# Patient Record
Sex: Male | Born: 1970 | Hispanic: Yes | Marital: Married | Smoking: Never smoker
Health system: Southern US, Community
[De-identification: ages and names within clinical notes are randomized; demographics above are authoritative.]

## PROBLEM LIST (undated history)

## (undated) DIAGNOSIS — K118 Other diseases of salivary glands: Secondary | ICD-10-CM

## (undated) HISTORY — DX: Other diseases of salivary glands: K11.8

---

## 2014-04-27 ENCOUNTER — Emergency Department: Payer: Self-pay | Admitting: Emergency Medicine

## 2018-10-06 DIAGNOSIS — Z8616 Personal history of COVID-19: Secondary | ICD-10-CM

## 2018-10-06 HISTORY — DX: Personal history of COVID-19: Z86.16

## 2021-10-03 ENCOUNTER — Emergency Department: Payer: Self-pay

## 2021-10-03 ENCOUNTER — Emergency Department
Admission: EM | Admit: 2021-10-03 | Discharge: 2021-10-03 | Disposition: A | Discharge status: Home / Self Care | Payer: Self-pay | Attending: Emergency Medicine | Admitting: Emergency Medicine

## 2021-10-03 ENCOUNTER — Other Ambulatory Visit: Payer: Self-pay

## 2021-10-03 DIAGNOSIS — R220 Localized swelling, mass and lump, head: Secondary | ICD-10-CM | POA: Insufficient documentation

## 2021-10-03 LAB — CBC WITH DIFFERENTIAL/PLATELET
Abs Immature Granulocytes: 0.01 10*3/uL (ref 0.00–0.07)
Basophils Absolute: 0 10*3/uL (ref 0.0–0.1)
Basophils Relative: 1 %
Eosinophils Absolute: 0.3 10*3/uL (ref 0.0–0.5)
Eosinophils Relative: 8 %
HCT: 37.5 % — ABNORMAL LOW (ref 39.0–52.0)
Hemoglobin: 13.1 g/dL (ref 13.0–17.0)
Immature Granulocytes: 0 %
Lymphocytes Relative: 26 %
Lymphs Abs: 1.1 10*3/uL (ref 0.7–4.0)
MCH: 32 pg (ref 26.0–34.0)
MCHC: 34.9 g/dL (ref 30.0–36.0)
MCV: 91.5 fL (ref 80.0–100.0)
Monocytes Absolute: 0.4 10*3/uL (ref 0.1–1.0)
Monocytes Relative: 9 %
Neutro Abs: 2.3 10*3/uL (ref 1.7–7.7)
Neutrophils Relative %: 56 %
Platelets: 271 10*3/uL (ref 150–400)
RBC: 4.1 MIL/uL — ABNORMAL LOW (ref 4.22–5.81)
RDW: 12 % (ref 11.5–15.5)
WBC: 4.1 10*3/uL (ref 4.0–10.5)
nRBC: 0 % (ref 0.0–0.2)

## 2021-10-03 LAB — COMPREHENSIVE METABOLIC PANEL
ALT: 29 U/L (ref 0–44)
AST: 21 U/L (ref 15–41)
Albumin: 3.8 g/dL (ref 3.5–5.0)
Alkaline Phosphatase: 73 U/L (ref 38–126)
Anion gap: 3 — ABNORMAL LOW (ref 5–15)
BUN: 14 mg/dL (ref 6–20)
CO2: 28 mmol/L (ref 22–32)
Calcium: 9 mg/dL (ref 8.9–10.3)
Chloride: 108 mmol/L (ref 98–111)
Creatinine, Ser: 0.92 mg/dL (ref 0.61–1.24)
GFR, Estimated: >60 mL/min (ref >60)
Glucose, Bld: 98 mg/dL (ref 70–99)
Potassium: 4.3 mmol/L (ref 3.5–5.1)
Sodium: 139 mmol/L (ref 135–145)
Total Bilirubin: 0.7 mg/dL (ref 0.3–1.2)
Total Protein: 8.4 g/dL — ABNORMAL HIGH (ref 6.5–8.1)

## 2021-10-03 MED ORDER — IOHEXOL 300 MG/ML  SOLN
75.0000 mL | Freq: Once | INTRAMUSCULAR | Status: AC | PRN
  Administered 2021-10-03: 16:00:00 75 mL via INTRAVENOUS
  Filled 2021-10-03: qty 75

## 2021-10-03 NOTE — ED Triage Notes (Signed)
Pt has an abscess on the R side of his face under his ear- pt states it started 4 weeks ago and has just gotten bigger- pt denies pain to the area

## 2021-10-03 NOTE — Discharge Instructions (Signed)
-  Please follow-up with the ENT provider listed above, as discussed. -Please return to the emergency department at any time if you begin to experience any new or worsening symptoms.

## 2021-10-03 NOTE — ED Provider Notes (Signed)
[facility] Emergency Department Provider Note    ____________________________________________   Event Date/Time   First MD Initiated Contact with Patient 10/03/21 1301     (approximate)  I have reviewed the triage vital signs and the nursing notes.   HISTORY  Chief Complaint Facial Swelling   HPI Charles Bowers is a 50 y.o. male, no known medical history, presents emergency department for evaluation of facial/neck swelling.  Patient states that he began to notice significant swelling under his right ear approximately 4 weeks ago.  States that it has progressively been getting worse.  It is not painful.  Denies fever/chills, difficulty swallowing, ear pain, sore throat, cough/congestion, chest pain, or shortness of breath.  Of note, patient states that he has not seen a doctor in 20 years.  He did however, have a dental procedure done approximately 1 year ago that required plates to be implanted where his molars were.  He states that they did this but he is molars were "falling apart".  Since then, he states that he has had significant weight loss.  He has lost 40 pounds over the course of 12 months without any change in diet or exercise.  History limited by: No limitations.  History reviewed. No pertinent past medical history.  There are no problems to display for this patient.   History reviewed. No pertinent surgical history.  Prior to Admission medications   Not on File    Allergies Patient has no known allergies.  No family history on file.  Social History Social History   Tobacco Use   Smoking status: Never   Smokeless tobacco: Never  Substance Use Topics   Alcohol use: Never    Review of Systems Review of Systems  Constitutional:  Negative for chills and fever.  HENT:  Negative for ear pain and sore throat.        Positive for facial/neck swelling.  Eyes:  Negative for blurred vision.  Respiratory:  Negative for sputum  production and shortness of breath.   Cardiovascular:  Negative for chest pain and leg swelling.  Gastrointestinal:  Negative for abdominal pain and vomiting.  Genitourinary:  Negative for dysuria, flank pain and hematuria.  Musculoskeletal:  Negative for myalgias.  Skin:  Negative for rash.  Neurological:  Negative for headaches.    10-point ROS otherwise negative. ____________________________________________   PHYSICAL EXAM:  VITAL SIGNS: ED Triage Vitals  Enc Vitals Group     BP 10/03/21 1229 111/66     Pulse Rate 10/03/21 1229 60     Resp 10/03/21 1229 18     Temp 10/03/21 1232 97.6 F (36.4 C)     Temp Source 10/03/21 1232 Oral     SpO2 10/03/21 1229 100 %     Weight 10/03/21 1231 140 lb (63.5 kg)     Height 10/03/21 1231 5\' 7"  (1.702 m)     Head Circumference --      Peak Flow --      Pain Score 10/03/21 1233 0     Pain Loc --      Pain Edu? --      Excl. in [place]? --     Physical Exam Constitutional:      General: He is not in acute distress.    Appearance: Normal appearance. He is not ill-appearing.  HENT:     Head: Normocephalic.     Comments: Large, non-tender, circumferential mass, approximately 5 cm in diameter, anterior and posterior to  the right ear.  No surrounding erythema.  No bleeding or discharge.    Nose: Nose normal.     Mouth/Throat:     Mouth: Mucous membranes are moist.     Pharynx: Oropharynx is clear.  Eyes:     Conjunctiva/sclera: Conjunctivae normal.     Pupils: Pupils are equal, round, and reactive to light.  Cardiovascular:     Rate and Rhythm: Normal rate and regular rhythm.  Pulmonary:     Effort: Pulmonary effort is normal. No respiratory distress.     Breath sounds: Normal breath sounds. No wheezing, rhonchi or rales.  Abdominal:     General: Abdomen is flat. There is no distension.     Palpations: Abdomen is soft.  Musculoskeletal:        General: Normal range of motion.     Cervical back: Normal range of motion and neck  supple.  Skin:    General: Skin is warm and dry.  Neurological:     General: No focal deficit present.     Mental Status: He is alert. Mental status is at baseline.  Psychiatric:        Mood and Affect: Mood normal.        Behavior: Behavior normal.        Thought Content: Thought content normal.        Judgment: Judgment normal.     ____________________________________________    LABS  (all labs ordered are listed, but only abnormal results are displayed)  Labs Reviewed  CBC WITH DIFFERENTIAL/PLATELET - Abnormal; Notable for the following components:      Result Value   RBC 4.10 (*)    HCT 37.5 (*)    All other components within normal limits  COMPREHENSIVE METABOLIC PANEL - Abnormal; Notable for the following components:   Total Protein 8.4 (*)    Anion gap 3 (*)    All other components within normal limits     ____________________________________________   EKG Not applicable.   ____________________________________________    RADIOLOGY I personally viewed and evaluated these images as part of my medical decision making, as well as reviewing the written report by the radiologist.  ED Provider Interpretation: I agree with the interpretation of the radiologist.  DG Chest 2 View  Result Date: 10/03/2021 CLINICAL DATA:  Unexplained weight loss. EXAM: CHEST - 2 VIEW COMPARISON:  Chest x-ray dated April 27, 2014. FINDINGS: The heart size and mediastinal contours are within normal limits. Both lungs are clear. The visualized skeletal structures are unremarkable. IMPRESSION: No active cardiopulmonary disease. Electronically Signed   By: Titus Dubin M.D.   On: 10/03/2021 14:02   CT Soft Tissue Neck W Contrast  Result Date: 10/03/2021 CLINICAL DATA:  Abscess on right-side of face under ear getting bigger, denies pain EXAM: CT NECK WITH CONTRAST TECHNIQUE: Multidetector CT imaging of the neck was performed using the standard protocol following the bolus  administration of intravenous contrast. CONTRAST:  73mL OMNIPAQUE IOHEXOL 300 MG/ML  SOLN COMPARISON:  None. FINDINGS: Pharynx and larynx: The nasal cavity and nasopharynx are unremarkable. There is a 1.4 cm cc by 1.8 cm TV by approximately 2.1 cm AP enhancing lesion in the left aspect of the soft palate (6-45, 2-26). There is no evidence of osseous erosion involving the adjacent hard palate. The oral cavity and oropharynx are otherwise unremarkable. The parapharyngeal spaces are clear. The hypopharynx and larynx are unremarkable. The vocal folds are normal. Salivary glands: There is a 3.6 cm AP by 2.9  cm TV by 3.8 cm cc solid lesion centered in the right parotid gland. The left parotid gland is abnormal in appearance with hyperdensity and interspersed fat. There is fatty atrophy of the submandibular glands which are essentially entirely replaced. Thyroid: Unremarkable. Lymph nodes: There are multiple enlarged right cervical chain lymph nodes: *1.4 cm x 1.6 cm by 1.8 cm right level IIa node with suspected internal necrosis but no definite extracapsular extension (2-36). *1.2 cm by 1.9 cm by 1.5 cm right level IIa lymph node just anteroinferior to the above-described node (2-34). *1.0 cm by 1.6 cm by 1.2 cm right level IIa node (2-31). *A few right level II B nodes measuring up to 6 mm in short axis. *1.2 cm x 1.5 cm by 1.6 cm right level IIa node (2-38). *Right level III nodes measuring 1.1 cm by 1.7 cm by 2.6 cm (2-55). And 1.2 cm by 1.4 cm by 1.8 cm (2-56). *Multiple prominent supraclavicular lymph nodes measuring up to 7 mm in short axis (2-68). On the left, there is a 0.9 cm x 1.1 cm by 1.5 cm level II a node (2-37). There are scattered additional subcentimeter cervical chain lymph nodes on the left. Vascular: There is mass effect on the right internal jugular vein by the above-described right level III nodes without evidence of occlusion. The vasculature is otherwise unremarkable. Limited intracranial: The  partially imaged intracranial compartment is unremarkable. Visualized orbits: The imaged globes and orbits are unremarkable. Mastoids and visualized paranasal sinuses: There is mild mucosal thickening in the imaged paranasal sinuses. The imaged mastoid air cells are clear. Skeleton: There is no aggressive osseous lesion to suggest osseous metastatic disease. Upper chest: The imaged lung apices are clear. Other: None. IMPRESSION: 1. Enhancing lesion in the soft palate described above suspicious for primary malignancy. There is bulky lymphadenopathy in the right neck with a large mass centered in the right parotid gland likely reflecting a metastatic lymph node, likely corresponding to the palpable abnormality. Some of the nodes on the right appear necrotic without evidence of extracapsular extension. Recommend ENT consultation. 2. Prominent left level II a lymph node measuring up to 0.9 cm in short axis is indeterminate but may also be metastatic. 3. Abnormal appearance of the bilateral parotid glands and fatty atrophy of the submandibular glands may reflect systemic process such as Sjogren's. Electronically Signed   By: Valetta Mole M.D.   On: 10/03/2021 16:57    ____________________________________________   PROCEDURES  Procedures   Medications  iohexol (OMNIPAQUE) 300 MG/ML solution 75 mL (75 mLs Intravenous Contrast Given 10/03/21 1614)    Critical Care performed: No  ____________________________________________   INITIAL IMPRESSION / ASSESSMENT AND PLAN / ED COURSE  Pertinent labs & imaging results that were available during my care of the patient were reviewed by me and considered in my medical decision making (see chart for details).      Charles Bowers is a 50 y.o. male, no known medical history, presents emergency department for evaluation of facial/neck swelling.  Patient states that he began to notice significant swelling under his right ear approximately 4 weeks ago.   States that it has progressively been getting worse.  It is not painful.  Denies fever/chills, difficulty swallowing, ear pain, sore throat, cough/congestion, chest pain, or shortness of breath.  Of note, patient states that he has not seen a doctor in 20 years.  He did however, have a dental procedure done approximately 1 year ago that required plates  to be implanted where his molars were.  He states that they did this but he is molars were "falling apart".  Since then, he states that he has had significant weight loss.  He has lost 40 pounds over the course of 12 months without any change in diet or exercise.  Upon entering the room, patient appears well.  NAD.  Physical exam notable for large, non-tender, circumferential mass, approximately 5 cm in diameter, anterior and posterior to the right ear.  No surrounding erythema.  No bleeding or discharge.  Otherwise unremarkable.  Vital signs are within normal limits.  CBC and CMP unremarkable.  CXR negative.  CT soft tissue neck shows enhancing lesion of the soft palate, suspicious for primary malignancy.  Bulky lymphadenopathy in the right neck with a large mass centered in the right parotid gland likely reflecting a metastatic lymph node.  I spoke with the on-call ENT specialist, Dr. Richardson Landry, who stated that he would follow up with this patient next week.   Patient is otherwise stable.  No further work-up or treatment indicated in the emergency department at this time.  We will plan to discharge this patient with instructions for follow-up.  Encouraged the patient to return to the emergency department at any time if he begins to experience any new or worsening symptoms.        ____________________________________________   FINAL CLINICAL IMPRESSION(S) / ED DIAGNOSES  Final diagnoses:  Right facial swelling     NEW MEDICATIONS STARTED DURING THIS VISIT:  ED Discharge Orders     None        Note:  This document was prepared  using Dragon voice recognition software and may include unintentional dictation errors.    Teodoro Spray, [place] 10/04/21 [id number]    Vladimir Crofts, MD 10/04/21 [phone]

## 2021-10-22 ENCOUNTER — Other Ambulatory Visit: Payer: Self-pay | Admitting: Otolaryngology

## 2021-10-22 DIAGNOSIS — K118 Other diseases of salivary glands: Secondary | ICD-10-CM

## 2021-10-30 ENCOUNTER — Ambulatory Visit
Admission: RE | Admit: 2021-10-30 | Discharge: 2021-10-30 | Disposition: A | Discharge status: Home / Self Care | Payer: Self-pay | Source: Ambulatory Visit | Attending: Otolaryngology | Admitting: Otolaryngology

## 2021-10-30 DIAGNOSIS — R221 Localized swelling, mass and lump, neck: Secondary | ICD-10-CM | POA: Insufficient documentation

## 2021-10-30 DIAGNOSIS — C8590 Non-Hodgkin lymphoma, unspecified, unspecified site: Secondary | ICD-10-CM

## 2021-10-30 DIAGNOSIS — K118 Other diseases of salivary glands: Secondary | ICD-10-CM | POA: Insufficient documentation

## 2021-10-30 HISTORY — DX: Non-Hodgkin lymphoma, unspecified, unspecified site: C85.90

## 2021-11-18 ENCOUNTER — Encounter: Payer: Self-pay | Admitting: Otolaryngology

## 2021-11-18 LAB — SURGICAL PATHOLOGY

## 2021-11-19 ENCOUNTER — Encounter: Payer: Self-pay | Admitting: *Deleted

## 2021-11-25 ENCOUNTER — Encounter: Payer: Self-pay | Admitting: Oncology

## 2021-11-25 ENCOUNTER — Inpatient Hospital Stay: Payer: Self-pay

## 2021-11-25 ENCOUNTER — Other Ambulatory Visit: Payer: Self-pay

## 2021-11-25 ENCOUNTER — Inpatient Hospital Stay: Payer: Self-pay | Attending: Oncology | Admitting: Oncology

## 2021-11-25 VITALS — BP 113/70 | HR 63 | Temp 98.0°F | Resp 16 | Ht 66.0 in | Wt 139.0 lb

## 2021-11-25 DIAGNOSIS — C884 Extranodal marginal zone B-cell lymphoma of mucosa-associated lymphoid tissue [MALT-lymphoma]: Secondary | ICD-10-CM

## 2021-11-25 DIAGNOSIS — C8510 Unspecified B-cell lymphoma, unspecified site: Secondary | ICD-10-CM | POA: Insufficient documentation

## 2021-11-25 LAB — CBC WITH DIFFERENTIAL/PLATELET
Abs Immature Granulocytes: 0 10*3/uL (ref 0.00–0.07)
Basophils Absolute: 0 10*3/uL (ref 0.0–0.1)
Basophils Relative: 1 %
Eosinophils Absolute: 0.4 10*3/uL (ref 0.0–0.5)
Eosinophils Relative: 10 %
HCT: 35.9 % — ABNORMAL LOW (ref 39.0–52.0)
Hemoglobin: 12.5 g/dL — ABNORMAL LOW (ref 13.0–17.0)
Immature Granulocytes: 0 %
Lymphocytes Relative: 22 %
Lymphs Abs: 0.9 10*3/uL (ref 0.7–4.0)
MCH: 31.6 pg (ref 26.0–34.0)
MCHC: 34.8 g/dL (ref 30.0–36.0)
MCV: 90.7 fL (ref 80.0–100.0)
Monocytes Absolute: 0.4 10*3/uL (ref 0.1–1.0)
Monocytes Relative: 9 %
Neutro Abs: 2.4 10*3/uL (ref 1.7–7.7)
Neutrophils Relative %: 58 %
Platelets: 249 10*3/uL (ref 150–400)
RBC: 3.96 MIL/uL — ABNORMAL LOW (ref 4.22–5.81)
RDW: 11.9 % (ref 11.5–15.5)
WBC: 4.1 10*3/uL (ref 4.0–10.5)
nRBC: 0 % (ref 0.0–0.2)

## 2021-11-25 LAB — COMPREHENSIVE METABOLIC PANEL
ALT: 28 U/L (ref 0–44)
AST: 21 U/L (ref 15–41)
Albumin: 4 g/dL (ref 3.5–5.0)
Alkaline Phosphatase: 68 U/L (ref 38–126)
Anion gap: 5 (ref 5–15)
BUN: 18 mg/dL (ref 6–20)
CO2: 23 mmol/L (ref 22–32)
Calcium: 8.8 mg/dL — ABNORMAL LOW (ref 8.9–10.3)
Chloride: 106 mmol/L (ref 98–111)
Creatinine, Ser: 0.82 mg/dL (ref 0.61–1.24)
GFR, Estimated: >60 mL/min (ref >60)
Glucose, Bld: 93 mg/dL (ref 70–99)
Potassium: 3.6 mmol/L (ref 3.5–5.1)
Sodium: 134 mmol/L — ABNORMAL LOW (ref 135–145)
Total Bilirubin: 0.2 mg/dL — ABNORMAL LOW (ref 0.3–1.2)
Total Protein: 8.4 g/dL — ABNORMAL HIGH (ref 6.5–8.1)

## 2021-11-25 LAB — HIV ANTIBODY (ROUTINE TESTING W REFLEX): HIV Screen 4th Generation wRfx: NONREACTIVE

## 2021-11-25 LAB — LACTATE DEHYDROGENASE: LDH: 131 U/L (ref 98–192)

## 2021-11-25 LAB — HEPATITIS B CORE ANTIBODY, TOTAL: Hep B Core Total Ab: NONREACTIVE

## 2021-11-25 NOTE — Progress Notes (Signed)
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 Richardson Landry  Date of visit: 11/25/21   History of presenting illness-[name] and history obtained with the help ofSpanish interpreter.  He does not have any known medical problems. He was noted to have swelling involving his right cheek that has been ongoing for the last 2 to 3 months.  Patient is here with his son today who reports that he feels that patient has lost a lot of weight in the last 3 months.  Patient endorses that his appetite is poor.  Denies any significant pain.  Patient was seen by ENT and underwent CT soft tissue neck which showed an enhancing lesion in the soft palate concerning for primary malignancy.  Bulky lymphadenopathy in the right neck with large mass centered in the right parotid gland multiple enlarged right cervical chain lymph nodes measuring up to 2 cm in size.  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  Ki-67 10 to 20%.  Overall immunoreactivity supports a differential diagnosis of extranodal marginal zone lymphoma versus lymphoplasmacytic lymphoma.  NYD 88 testing on the biopsy specimen was negative.  Patient referred for further management.  ECOG PS- 0  Pain scale- 0   Review of systems- Review of Systems  Constitutional:  Positive for malaise/fatigue and weight loss. Negative for chills and fever.  HENT:  Negative for  congestion, ear discharge and nosebleeds.   Eyes:  Negative for blurred vision.  Respiratory:  Negative for cough, hemoptysis, sputum production, shortness of breath and wheezing.   Cardiovascular:  Negative for chest pain, palpitations, orthopnea and claudication.  Gastrointestinal:  Negative for abdominal pain, blood in stool, constipation, diarrhea, heartburn, melena, nausea and vomiting.  Genitourinary:  Negative for dysuria, flank pain, frequency, hematuria and urgency.  Musculoskeletal:  Negative for back pain, joint pain and myalgias.  Skin:  Negative for rash.  Neurological:  Negative for dizziness, tingling, focal weakness, seizures, weakness and headaches.  Endo/Heme/Allergies:  Does not bruise/bleed easily.  Psychiatric/Behavioral:  Negative for depression and suicidal ideas. The patient does not have insomnia.    No Known Allergies  There are no problems to display for this patient.    Past Medical History:  Diagnosis Date   History of COVID-19 2020   Lymphoma ([place]) 10/30/2021   low grade b cell lymphoma   Mass of right parotid gland      History reviewed. No pertinent surgical history.  Social History   Socioeconomic History   Marital status: Single    Spouse name: Not on file   Number of children: Not on file   Years of education: Not on file   Highest education level: Not on file  Occupational History   Not on file  Tobacco Use   Smoking status: Never    Passive exposure: Never   Smokeless tobacco: Never  Vaping Use   Vaping Use: Never used  Substance and Sexual Activity   Alcohol use: Never   Drug use: Never  Sexual activity: Yes  Other Topics Concern   Not on file  Social History Narrative   Not on file   Social Determinants of Health   Financial Resource Strain: Not on file  Food Insecurity: Not on file  Transportation Needs: Not on file  Physical Activity: Not on file  Stress: Not on file  Social Connections: Not on file  Intimate  Partner Violence: Not on file     History reviewed. No pertinent family history.   Current Outpatient Medications:    acetaminophen (TYLENOL) 500 MG tablet, Take 500 mg by mouth daily., Disp: , Rfl:    Physical exam:  Vitals:   11/25/21 1637  BP: 113/70  Pulse: 63  Resp: 16  Temp: 98 F (36.7 C)  TempSrc: Oral  Weight: 139 lb (63 kg)  Height: 5' 6"  (1.676 m)   Physical Exam Constitutional:      General: He is not in acute distress. Cardiovascular:     Rate and Rhythm: Normal rate and regular rhythm.     Heart sounds: Normal heart sounds.  Pulmonary:     Effort: Pulmonary effort is normal.     Breath sounds: Normal breath sounds.  Abdominal:     General: Bowel sounds are normal.     Palpations: Abdomen is soft.  Lymphadenopathy:     Comments: Palpable right-sided cervical adenopathy noted at the base of the liver and extending downwards towards the neck.  No palpable left cervical adenopathy.  No palpable bilateral axillary or inguinal adenopathy.  Skin:    General: Skin is warm and dry.  Neurological:     Mental Status: He is alert and oriented to person, place, and time.       CMP Latest Ref Rng & Units 11/25/2021  Glucose 70 - 99 mg/dL 93  BUN 6 - 20 mg/dL 18  Creatinine 0.61 - 1.24 mg/dL 0.82  Sodium 135 - 145 mmol/L 134(L)  Potassium 3.5 - 5.1 mmol/L 3.6  Chloride 98 - 111 mmol/L 106  CO2 22 - 32 mmol/L 23  Calcium 8.9 - 10.3 mg/dL 8.8(L)  Total Protein 6.5 - 8.1 g/dL 8.4(H)  Total Bilirubin 0.3 - 1.2 mg/dL 0.2(L)  Alkaline Phos 38 - 126 U/L 68  AST 15 - 41 U/L 21  ALT 0 - 44 U/L 28   CBC Latest Ref Rng & Units 11/25/2021  WBC 4.0 - 10.5 K/uL 4.1  Hemoglobin 13.0 - 17.0 g/dL 12.5(L)  Hematocrit 39.0 - 52.0 % 35.9(L)  Platelets 150 - 400 K/uL 249    No images are attached to the encounter.  [place] CORE BIOPSY (LYMPH NODES)  Result Date: 10/30/2021 INDICATION: Right-sided parotid mass versus pathologic lymph node EXAM: Ultrasound-guided core needle  biopsy of right-sided submandibular mass MEDICATIONS: None. ANESTHESIA/SEDATION: Local analgesia FLUOROSCOPY TIME:  N/a COMPLICATIONS: None immediate. PROCEDURE: Informed written consent was obtained from the patient after a thorough discussion of the procedural risks, benefits and alternatives. All questions were addressed. Maximal Sterile Barrier Technique was utilized including caps, mask, sterile gowns, sterile gloves, sterile drape, hand hygiene and skin antiseptic. A timeout was performed prior to the initiation of the procedure. The patient was placed supine on the exam table. Limited ultrasound of the right submandibular region was performed. This again demonstrated a lobulated and heterogeneous mass in the area of the right parotid gland, compatible with right parotid mass versus pathologic lymphadenopathy/metastatic involvement. Skin entry site was marked, and the overlying skin was prepped and draped in the standard sterile fashion.  Local analgesia was obtained with 1% lidocaine. Under ultrasound guidance, core needle biopsy was performed of the identified lesion using an 18 gauge core biopsy device. A total of 6 passes were made, and specimens were submitted in saline to pathology for further handling. Limited postprocedure imaging demonstrated expected post biopsy changes without hematoma or complicating feature. After hemostasis manual pressure, clean dressing was placed. The patient tolerated the procedure well without immediate complication. IMPRESSION: Successful ultrasound-guided core needle biopsy of right submandibular mass. Electronically Signed   By: Albin Felling M.D.   On: 10/30/2021 14:50    Assessment and plan- Patient is a 51 y.o. male with newly diagnosed extranodal marginal B-cell lymphoma  Discussed the results of the CT soft tissue neck and pathology with the patient and his son in detail.  Parotid mass biopsy was consistent with low-grade B-cell lymphoma either lymphoplasmacytic  lymphoma or extranodal marginal zone lymphoma.  NYD 88 was negative and thereby making extranodal marginal zone lymphoma more likely.  However I would like to get a PET scan to see if there are any other hypermetabolic areas which would affect staging.  Also typically low-grade lymphoma should have a low SUV uptake.  If there are any high SUV uptake areas noted on PET scan I would be inclined to repeat biopsy of those lymph nodes.  I will also obtain bone marrow biopsy to complete his staging work-up.  If all this ultimately turns out to be low-grade extranodal marginal B-cell lymphoma I am inclined to treat this with Rituxan based combination chemotherapy given that patient is symptomatic from the bulk of the tumor.  It is unlikely that radiation alone would suffice to treat this tumor.  I will see the patient after PET scan and bone marrow biopsy results are back to discuss further management   Thank you for this kind referral and the opportunity to participate in the care of this patient   Visit Diagnosis 1. Extranodal marginal zone B-cell lymphoma (HCC)     Dr. Randa Evens, MD, MPH [facility] [phone] 11/25/2021  4:32 PM

## 2021-11-26 ENCOUNTER — Telehealth: Payer: Self-pay

## 2021-11-26 LAB — KAPPA/LAMBDA LIGHT CHAINS
Kappa free light chain: 53 mg/L — ABNORMAL HIGH (ref 3.3–19.4)
Kappa, lambda light chain ratio: 2.3 — ABNORMAL HIGH (ref 0.26–1.65)
Lambda free light chains: 23 mg/L (ref 5.7–26.3)

## 2021-11-26 NOTE — Telephone Encounter (Signed)
Attempted to reach patient x2. No answer, but left detailed VM with appt info for BM bx, PET scan and MD follow up. Left office phone number to call back with any questions. Also mailed pt appt reminders with spanish instructions.

## 2021-11-26 NOTE — Telephone Encounter (Signed)
Attempted to reach pt's son, Aaron Edelman, and no answer. Detailed VM left as well.

## 2021-11-27 ENCOUNTER — Encounter: Payer: Self-pay | Admitting: Licensed Clinical Social Worker

## 2021-11-27 NOTE — Telephone Encounter (Signed)
Attempted to contact pt again x2 and no answer. I was able to talk to pt's son Aaron Edelman and reviewed appts and instructions with him. He verbalized understanding.

## 2021-11-27 NOTE — Progress Notes (Signed)
[place] Work  Clinical Social Work was referred by Engineer, site for assessment of psychosocial needs.  Clinical Social Worker contacted patient by phone  to offer support and assess for needs.  CSW left voicemail in Spanish with contact information and request for return call.   Adelene Amas, [place]       First Attempt

## 2021-12-02 ENCOUNTER — Other Ambulatory Visit: Payer: Self-pay | Admitting: Radiology

## 2021-12-03 ENCOUNTER — Ambulatory Visit
Admission: RE | Admit: 2021-12-03 | Discharge: 2021-12-03 | Disposition: A | Discharge status: Home / Self Care | Payer: Self-pay | Source: Ambulatory Visit | Attending: Oncology | Admitting: Oncology

## 2021-12-03 ENCOUNTER — Other Ambulatory Visit: Payer: Self-pay

## 2021-12-03 DIAGNOSIS — D649 Anemia, unspecified: Secondary | ICD-10-CM | POA: Insufficient documentation

## 2021-12-03 DIAGNOSIS — C884 Extranodal marginal zone B-cell lymphoma of mucosa-associated lymphoid tissue [MALT-lymphoma]: Secondary | ICD-10-CM | POA: Insufficient documentation

## 2021-12-03 DIAGNOSIS — D72822 Plasmacytosis: Secondary | ICD-10-CM | POA: Insufficient documentation

## 2021-12-03 LAB — CBC WITH DIFFERENTIAL/PLATELET
Abs Immature Granulocytes: 0.01 10*3/uL (ref 0.00–0.07)
Basophils Absolute: 0 10*3/uL (ref 0.0–0.1)
Basophils Relative: 1 %
Eosinophils Absolute: 0.5 10*3/uL (ref 0.0–0.5)
Eosinophils Relative: 10 %
HCT: 33.6 % — ABNORMAL LOW (ref 39.0–52.0)
Hemoglobin: 11.6 g/dL — ABNORMAL LOW (ref 13.0–17.0)
Immature Granulocytes: 0 %
Lymphocytes Relative: 29 %
Lymphs Abs: 1.5 10*3/uL (ref 0.7–4.0)
MCH: 30.8 pg (ref 26.0–34.0)
MCHC: 34.5 g/dL (ref 30.0–36.0)
MCV: 89.1 fL (ref 80.0–100.0)
Monocytes Absolute: 0.4 10*3/uL (ref 0.1–1.0)
Monocytes Relative: 8 %
Neutro Abs: 2.8 10*3/uL (ref 1.7–7.7)
Neutrophils Relative %: 52 %
Platelets: 264 10*3/uL (ref 150–400)
RBC: 3.77 MIL/uL — ABNORMAL LOW (ref 4.22–5.81)
RDW: 11.9 % (ref 11.5–15.5)
WBC: 5.3 10*3/uL (ref 4.0–10.5)
nRBC: 0 % (ref 0.0–0.2)

## 2021-12-03 MED ORDER — FENTANYL CITRATE (PF) 100 MCG/2ML IJ SOLN
INTRAMUSCULAR | Status: AC | PRN
Start: 2021-12-03 — End: 2021-12-03
  Administered 2021-12-03: 50 ug via INTRAVENOUS

## 2021-12-03 MED ORDER — MIDAZOLAM HCL 5 MG/5ML IJ SOLN
INTRAMUSCULAR | Status: AC | PRN
  Administered 2021-12-03: 1 mg via INTRAVENOUS

## 2021-12-03 MED ORDER — HEPARIN SOD (PORK) LOCK FLUSH 100 UNIT/ML IV SOLN
INTRAVENOUS | Status: AC
  Filled 2021-12-03: qty 5

## 2021-12-03 MED ORDER — MIDAZOLAM HCL 2 MG/2ML IJ SOLN
INTRAMUSCULAR | Status: AC
  Filled 2021-12-03: qty 2

## 2021-12-03 MED ORDER — FENTANYL CITRATE (PF) 100 MCG/2ML IJ SOLN
INTRAMUSCULAR | Status: AC
  Filled 2021-12-03: qty 2

## 2021-12-03 MED ORDER — SODIUM CHLORIDE 0.9 % IV SOLN: INTRAVENOUS | Status: DC

## 2021-12-03 NOTE — Progress Notes (Signed)
Patient clinically stable post BMB per Dr Denna Haggard, tolerated well. Vitals stable pre and post procedure. Received Versed 1 mg along with Fentanyl 50 mcg IV for procedure. Son at bedside post procedure with update given. Report given to [facility] RN post procedure/specials.

## 2021-12-03 NOTE — Procedures (Signed)
Interventional Radiology Procedure Note  Date of Procedure: 12/03/2021  Procedure: BMBx  Findings:  1. CT BMBx right posterior ilium    Complications: No immediate complications noted.   Estimated Blood Loss: minimal  Follow-up and Recommendations: 1. Bedrest 1 hour    Albin Felling, MD  Vascular & Interventional Radiology  12/03/2021 9:46 AM

## 2021-12-03 NOTE — H&P (Signed)
Chief Complaint: Patient was seen in consultation today for lymphoma at the request of [place] [facility]  Referring Physician(s): Rao,Archana C  Supervising Physician: Juliet Rude  Patient Status: [facility] - Out-pt  History of Present Illness: Charles Bowers is a 51 y.o. male with PMHx significant for newly diagnosed extranodal marginal B-cell lymphoma s/p right sided submandibular biopsy with IR 10/30/21. The patient follows with Oncology, Dr. Janese Banks and request received for image guided bone marrow biopsy with moderate sedation.   The patient has had a H&P performed within the last 30 days, all history, medications, and exam have been reviewed. The patient denies any interval changes since the H&P.  The patient denies any current chest pain or shortness of breath. He has no known complications to sedation.   Past Medical History:  Diagnosis Date   History of COVID-19 2020   Lymphoma ([place]) 10/30/2021   low grade b cell lymphoma   Mass of right parotid gland     History reviewed. No pertinent surgical history.  Allergies: Patient has no known allergies.  Medications: Prior to Admission medications   Medication Sig Start Date End Date Taking? Authorizing Provider  acetaminophen (TYLENOL) 500 MG tablet Take 500 mg by mouth daily.    [provider]     History reviewed. No pertinent family history.  Social History   Socioeconomic History   Marital status: Married    Spouse name: Not on file   Number of children: Not on file   Years of education: Not on file   Highest education level: Not on file  Occupational History   Not on file  Tobacco Use   Smoking status: Never    Passive exposure: Never   Smokeless tobacco: Never  Vaping Use   Vaping Use: Never used  Substance and Sexual Activity   Alcohol use: Never   Drug use: Never   Sexual activity: Yes  Other Topics Concern   Not on file  Social History Narrative   Not on file   Social  Determinants of Health   Financial Resource Strain: Not on file  Food Insecurity: Not on file  Transportation Needs: Not on file  Physical Activity: Not on file  Stress: Not on file  Social Connections: Not on file   Review of Systems: A 12 point ROS discussed and pertinent positives are indicated in the HPI above.  All other systems are negative.  Review of Systems  Vital Signs: BP 110/75    Pulse 64    Temp 97.7 F (36.5 C) (Oral)    Resp 17    SpO2 97%   Physical Exam Constitutional:      Appearance: Normal appearance.  HENT:     Head: Normocephalic and atraumatic.  Cardiovascular:     Rate and Rhythm: Normal rate and regular rhythm.  Pulmonary:     Effort: Pulmonary effort is normal.     Breath sounds: Normal breath sounds.  Skin:    General: Skin is warm and dry.  Neurological:     Mental Status: He is alert and oriented to person, place, and time.    Imaging: No results found.  Labs:  CBC: Recent Labs    10/03/21 1333 11/25/21 1223  WBC 4.1 4.1  HGB 13.1 12.5*  HCT 37.5* 35.9*  PLT 271 249    COAGS: No results for input(s): INR, APTT in the last 8760 hours.  BMP: Recent Labs    10/03/21 1333 11/25/21 1223  NA 139 134*  K 4.3 3.6  CL 108 106  CO2 28 23  GLUCOSE 98 93  BUN 14 18  CALCIUM 9.0 8.8*  CREATININE 0.92 0.82  GFRNONAA >60 >60    LIVER FUNCTION TESTS: Recent Labs    10/03/21 1333 11/25/21 1223  BILITOT 0.7 0.2*  AST 21 21  ALT 29 28  ALKPHOS 73 68  PROT 8.4* 8.4*  ALBUMIN 3.8 4.0    Assessment and Plan: This is a 51 year old male with PMHx significant for newly diagnosed extranodal marginal B-cell lymphoma s/p submandibular biopsy with IR 10/30/21. The patient follows with Oncology, Dr. Janese Banks and request received for image guided bone marrow biopsy with moderate sedation.   The patient has been NPO, no medical changes since recent office visit, labs and vitals have been reviewed.  Risks and benefits of image guided bone  marrow biopsy with moderate sedation was discussed with the patient and/or patient's family and medical spanish interpreter today including, but not limited to bleeding, infection, damage to adjacent structures or low yield requiring additional tests.  All of the questions were answered and there is agreement to proceed.  Consent signed and in chart.   Thank you for this interesting consult.  I greatly enjoyed meeting [name] and look forward to participating in their care.  A copy of this report was sent to the requesting provider on this date.  Electronically Signed: Hedy Jacob, PA-C 12/03/2021, 8:00 AM   I spent a total of 15 Minutes in face to face in clinical consultation, greater than 50% of which was counseling/coordinating care for extranodal marginal B cell lymphoma.

## 2021-12-05 ENCOUNTER — Encounter: Payer: Self-pay | Admitting: Licensed Clinical Social Worker

## 2021-12-05 NOTE — Progress Notes (Addendum)
[place] ?Clinical Social Work ? ?Clinical Social Work was referred by nurse navigator for assessment of psychosocial needs.  Clinical Social Worker contacted patient by phone  to offer support and assess for needs.  CSW left contact information and request for return call. ? ? ? ?Benney Sommerville, LCSW  ?Clinical Social Worker ?[place] ?      ?Second Attempt ?

## 2021-12-06 ENCOUNTER — Ambulatory Visit (HOSPITAL_COMMUNITY)
Admission: RE | Admit: 2021-12-06 | Discharge: 2021-12-06 | Disposition: A | Discharge status: Home / Self Care | Payer: Self-pay | Source: Ambulatory Visit | Attending: Oncology | Admitting: Oncology

## 2021-12-06 ENCOUNTER — Other Ambulatory Visit: Payer: Self-pay

## 2021-12-06 DIAGNOSIS — C884 Extranodal marginal zone B-cell lymphoma of mucosa-associated lymphoid tissue [MALT-lymphoma]: Secondary | ICD-10-CM | POA: Insufficient documentation

## 2021-12-06 LAB — GLUCOSE, CAPILLARY: Glucose-Capillary: 100 mg/dL — ABNORMAL HIGH (ref 70–99)

## 2021-12-06 MED ORDER — FLUDEOXYGLUCOSE F - 18 (FDG) INJECTION
7.0000 | Freq: Once | INTRAVENOUS | Status: AC
  Administered 2021-12-06: 7.2 via INTRAVENOUS

## 2021-12-11 ENCOUNTER — Other Ambulatory Visit: Payer: Self-pay

## 2021-12-11 ENCOUNTER — Encounter (HOSPITAL_COMMUNITY): Payer: Self-pay | Admitting: Oncology

## 2021-12-11 ENCOUNTER — Encounter: Payer: Self-pay | Admitting: Oncology

## 2021-12-11 ENCOUNTER — Inpatient Hospital Stay: Payer: Self-pay | Attending: Oncology | Admitting: Oncology

## 2021-12-11 VITALS — BP 121/77 | HR 68 | Temp 97.3°F | Resp 16 | Wt 139.6 lb

## 2021-12-11 DIAGNOSIS — Z8616 Personal history of COVID-19: Secondary | ICD-10-CM | POA: Insufficient documentation

## 2021-12-11 DIAGNOSIS — Z79899 Other long term (current) drug therapy: Secondary | ICD-10-CM | POA: Insufficient documentation

## 2021-12-11 DIAGNOSIS — Z5111 Encounter for antineoplastic chemotherapy: Secondary | ICD-10-CM | POA: Insufficient documentation

## 2021-12-11 DIAGNOSIS — C884 Extranodal marginal zone B-cell lymphoma of mucosa-associated lymphoid tissue [MALT-lymphoma]: Secondary | ICD-10-CM | POA: Insufficient documentation

## 2021-12-11 DIAGNOSIS — Z7189 Other specified counseling: Secondary | ICD-10-CM

## 2021-12-11 NOTE — Progress Notes (Signed)
The patient is curious and will like to talk about sexual relations with his wife, his wife believes cancer is contagious. 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  ?

## 2021-12-12 LAB — SURGICAL PATHOLOGY

## 2021-12-13 ENCOUNTER — Encounter: Payer: Self-pay | Admitting: Oncology

## 2021-12-13 DIAGNOSIS — C884 Extranodal marginal zone b-cell lymphoma of mucosa-associated lymphoid tissue (malt-lymphoma) not having achieved remission: Secondary | ICD-10-CM | POA: Insufficient documentation

## 2021-12-13 NOTE — Progress Notes (Unsigned)
Hematology/Oncology Consult note [facility]  Telephone:([phone] Fax:([phone]) [phone]  Patient Care Team: Pcp, No as PCP - General Sindy Guadeloupe, MD as Consulting Physician (Oncology) Clyde Canterbury, MD as Referring Physician (Otolaryngology)   Name of the patient: Charles Bowers  [id number]  Sep 29, 1971   Date of visit: 12/13/21  Diagnosis- ***  Chief complaint/ Reason for visit- ***  Heme/Onc history: ***  Interval history- ***  ECOG PS- *** Pain scale- *** Opioid associated constipation- ***  Review of systems- ROS   Current treatment- ***  No Known Allergies   Past Medical History:  Diagnosis Date   History of COVID-19 2020   Lymphoma ([place]) 10/30/2021   low grade b cell lymphoma   Mass of right parotid gland      History reviewed. No pertinent surgical history.  Social History   Socioeconomic History   Marital status: Married    Spouse name: Not on file   Number of children: Not on file   Years of education: Not on file   Highest education level: Not on file  Occupational History   Not on file  Tobacco Use   Smoking status: Never    Passive exposure: Never   Smokeless tobacco: Never  Vaping Use   Vaping Use: Never used  Substance and Sexual Activity   Alcohol use: Never   Drug use: Never   Sexual activity: Yes  Other Topics Concern   Not on file  Social History Narrative   Not on file   Social Determinants of Health   Financial Resource Strain: Not on file  Food Insecurity: Not on file  Transportation Needs: Not on file  Physical Activity: Not on file  Stress: Not on file  Social Connections: Not on file  Intimate Partner Violence: Not on file    History reviewed. No pertinent family history.   Current Outpatient Medications:    acetaminophen (TYLENOL) 500 MG tablet, Take 500 mg by mouth daily., Disp: , Rfl:   Physical exam:  Vitals:   12/11/21 1309  BP: 121/77  Pulse: 68  Resp: 16   Temp: (!) 97.3 F (36.3 C)  SpO2: 99%  Weight: 139 lb 9.6 oz (63.3 kg)   Physical Exam   CMP Latest Ref Rng & Units 11/25/2021  Glucose 70 - 99 mg/dL 93  BUN 6 - 20 mg/dL 18  Creatinine 0.61 - 1.24 mg/dL 0.82  Sodium 135 - 145 mmol/L 134(L)  Potassium 3.5 - 5.1 mmol/L 3.6  Chloride 98 - 111 mmol/L 106  CO2 22 - 32 mmol/L 23  Calcium 8.9 - 10.3 mg/dL 8.8(L)  Total Protein 6.5 - 8.1 g/dL 8.4(H)  Total Bilirubin 0.3 - 1.2 mg/dL 0.2(L)  Alkaline Phos 38 - 126 U/L 68  AST 15 - 41 U/L 21  ALT 0 - 44 U/L 28   CBC Latest Ref Rng & Units 12/03/2021  WBC 4.0 - 10.5 K/uL 5.3  Hemoglobin 13.0 - 17.0 g/dL 11.6(L)  Hematocrit 39.0 - 52.0 % 33.6(L)  Platelets 150 - 400 K/uL 264    No images are attached to the encounter.  NM PET Image Initial (PI) Skull Base To Thigh  Result Date: 12/08/2021 CLINICAL DATA:  Initial treatment strategy for extranodal marginal zone B-cell lymphoma. EXAM: NUCLEAR MEDICINE PET SKULL BASE TO THIGH TECHNIQUE: 7.2 mCi F-18 FDG was injected intravenously. Full-ring PET imaging was performed from the skull base to thigh after the radiotracer. CT data was obtained and  used for attenuation correction and anatomic localization. Fasting blood glucose: 100 mg/dl COMPARISON:  10/03/2021 neck CT. FINDINGS: Mediastinal blood pool activity: SUV max 1.8 Liver activity: SUV max 2.9 NECK: Hypermetabolic solid 2.9 x 2.1 cm right parotid mass with max SUV 4.1 (series 4/image 28), mildly decreased from 3.7 x 2.7 cm on 10/03/2021 CT. A few mildly enlarged mildly hypermetabolic high right level 2 neck lymph nodes, largest 1.2 cm short axis diameter with max SUV 4.3 (series 4/image 35), mildly decreased from 1.4 cm. Mildly hypermetabolic 1.7 x 0.9 cm soft tissue nodule anterior to the right parotid gland with max SUV 3.9 (series 4/image 28), minimally decreased from 1.8 x 0.9 cm. Mildly hypermetabolic 0.9 cm left level 2 neck lymph node with max SUV 2.7 (series 4/image 35), stable in size.  Incidental CT findings: none CHEST: No enlarged or hypermetabolic axillary, mediastinal or hilar lymph nodes. No hypermetabolic pulmonary findings. Incidental CT findings: No significant pulmonary nodules. ABDOMEN/PELVIS: No abnormal hypermetabolic activity within the liver, pancreas, adrenal glands, or spleen. No hypermetabolic lymph nodes in the abdomen or pelvis. Incidental CT findings: Suggestion of diffuse bladder wall thickening, potentially due to under distension. SKELETON: No focal hypermetabolic activity to suggest skeletal metastasis. Incidental CT findings: none IMPRESSION: 1. Hypermetabolic right parotid mass, soft tissue nodule anterior to the right parotid gland and bilateral level 2 neck lymph nodes, compatible with lymphoma, all stable to mildly decreased in size since 09/13/2021 neck CT. Deauville category 4. 2. No hypermetabolic lymphoma in the chest, abdomen, pelvis or skeleton. Normal size spleen. 3. Suggestion of diffuse bladder wall thickening, potentially due to underdistension. Consider correlation with urinalysis. Electronically Signed   By: Ilona Sorrel M.D.   On: 12/08/2021 15:03   CT BONE MARROW BIOPSY & ASPIRATION  Result Date: 12/03/2021 INDICATION: extranodal marginal b cell  zone  lymphoma EXAM: CT BONE MARROW BIOPSY AND ASPIRATION MEDICATIONS: None. ANESTHESIA/SEDATION: Moderate (conscious) sedation was employed during this procedure. A total of Versed 1 mg and Fentanyl 50 mcg was administered intravenously. Moderate Sedation Time: 10 minutes. The patient's level of consciousness and vital signs were monitored continuously by radiology nursing throughout the procedure under my direct supervision. FLUOROSCOPY TIME:  N/a COMPLICATIONS: None immediate. PROCEDURE: Informed written consent was obtained from the patient after a thorough discussion of the procedural risks, benefits and alternatives. All questions were addressed. Maximal Sterile Barrier Technique was utilized including  caps, mask, sterile gowns, sterile gloves, sterile drape, hand hygiene and skin antiseptic. A timeout was performed prior to the initiation of the procedure. The patient was placed prone on the CT exam table. Limited CT of the pelvis was performed for planning purposes. Skin entry site was marked, and the overlying skin was prepped and draped in the standard sterile fashion. Local analgesia was obtained with 1% lidocaine. Using CT guidance, an 11 gauge needle was advanced just deep to the cortex of the right posterior ilium. Subsequently, bone marrow aspiration and core biopsy were performed. Specimens were submitted to lab/pathology for handling. Hemostasis was achieved with manual pressure, and a clean dressing was placed. The patient tolerated the procedure well without immediate complication. IMPRESSION: Successful CT-guided bone marrow aspiration and core biopsy of the right posterior ilium. Electronically Signed   By: Albin Felling M.D.   On: 12/03/2021 11:46     Assessment and plan- Patient is a 51 y.o. male ***   Visit Diagnosis 1. Extranodal marginal zone B-cell lymphoma (HCC)      Dr. Randa Evens, MD, MPH  [facility] at [facility] [phone] 12/13/2021 5:23 PM

## 2021-12-13 NOTE — Progress Notes (Signed)
START OFF PATHWAY REGIMEN - Lymphoma and CLL ? ? ?[id number]:Rituximab (Weekly): ?  Administer weekly: ?    Rituximab-xxxx  ? ?**Always confirm dose/schedule in your pharmacy ordering system** ? ?Patient Characteristics: ?Marginal Zone Lymphoma, Localized, Other ?Disease Type: Marginal Zone Lymphoma ?Disease Type: Not Applicable ?Disease Type: Not Applicable ?Localized or Systemic Disease<= Localized ?Localized Disease Type: Other ?Intent of Therapy: ?Non-Curative / Palliative Intent, Discussed with Patient ?

## 2021-12-18 ENCOUNTER — Inpatient Hospital Stay: Payer: Self-pay

## 2021-12-20 ENCOUNTER — Inpatient Hospital Stay: Payer: Self-pay

## 2021-12-20 ENCOUNTER — Other Ambulatory Visit: Payer: Self-pay

## 2021-12-20 VITALS — BP 105/58 | HR 73 | Temp 98.4°F | Resp 20 | Wt 140.4 lb

## 2021-12-20 DIAGNOSIS — C884 Extranodal marginal zone B-cell lymphoma of mucosa-associated lymphoid tissue [MALT-lymphoma]: Secondary | ICD-10-CM

## 2021-12-20 LAB — COMPREHENSIVE METABOLIC PANEL
ALT: 25 U/L (ref 0–44)
AST: 24 U/L (ref 15–41)
Albumin: 3.9 g/dL (ref 3.5–5.0)
Alkaline Phosphatase: 68 U/L (ref 38–126)
Anion gap: 7 (ref 5–15)
BUN: 16 mg/dL (ref 6–20)
CO2: 24 mmol/L (ref 22–32)
Calcium: 8.8 mg/dL — ABNORMAL LOW (ref 8.9–10.3)
Chloride: 104 mmol/L (ref 98–111)
Creatinine, Ser: 1.05 mg/dL (ref 0.61–1.24)
GFR, Estimated: >60 mL/min (ref >60)
Glucose, Bld: 128 mg/dL — ABNORMAL HIGH (ref 70–99)
Potassium: 3.5 mmol/L (ref 3.5–5.1)
Sodium: 135 mmol/L (ref 135–145)
Total Bilirubin: 0.5 mg/dL (ref 0.3–1.2)
Total Protein: 8.1 g/dL (ref 6.5–8.1)

## 2021-12-20 LAB — CBC WITH DIFFERENTIAL/PLATELET
Abs Immature Granulocytes: 0.01 10*3/uL (ref 0.00–0.07)
Basophils Absolute: 0 10*3/uL (ref 0.0–0.1)
Basophils Relative: 1 %
Eosinophils Absolute: 0.4 10*3/uL (ref 0.0–0.5)
Eosinophils Relative: 9 %
HCT: 37.1 % — ABNORMAL LOW (ref 39.0–52.0)
Hemoglobin: 13 g/dL (ref 13.0–17.0)
Immature Granulocytes: 0 %
Lymphocytes Relative: 31 %
Lymphs Abs: 1.5 10*3/uL (ref 0.7–4.0)
MCH: 31.5 pg (ref 26.0–34.0)
MCHC: 35 g/dL (ref 30.0–36.0)
MCV: 89.8 fL (ref 80.0–100.0)
Monocytes Absolute: 0.4 10*3/uL (ref 0.1–1.0)
Monocytes Relative: 8 %
Neutro Abs: 2.4 10*3/uL (ref 1.7–7.7)
Neutrophils Relative %: 51 %
Platelets: 238 10*3/uL (ref 150–400)
RBC: 4.13 MIL/uL — ABNORMAL LOW (ref 4.22–5.81)
RDW: 11.8 % (ref 11.5–15.5)
WBC: 4.7 10*3/uL (ref 4.0–10.5)
nRBC: 0 % (ref 0.0–0.2)

## 2021-12-20 LAB — HEPATITIS B SURFACE ANTIGEN: Hepatitis B Surface Ag: NONREACTIVE

## 2021-12-20 LAB — HEPATITIS B CORE ANTIBODY, TOTAL: Hep B Core Total Ab: NONREACTIVE

## 2021-12-20 MED ORDER — DIPHENHYDRAMINE HCL 25 MG PO CAPS
50.0000 mg | ORAL_CAPSULE | Freq: Once | ORAL | Status: AC
  Administered 2021-12-20: 50 mg via ORAL
  Filled 2021-12-20: qty 2

## 2021-12-20 MED ORDER — ACETAMINOPHEN 325 MG PO TABS
650.0000 mg | ORAL_TABLET | Freq: Once | ORAL | Status: AC
  Administered 2021-12-20: 650 mg via ORAL
  Filled 2021-12-20: qty 2

## 2021-12-20 MED ORDER — SODIUM CHLORIDE 0.9 % IV SOLN
Freq: Once | INTRAVENOUS | Status: AC
  Filled 2021-12-20: qty 250

## 2021-12-20 MED ORDER — SODIUM CHLORIDE 0.9 % IV SOLN
375.0000 mg/m2 | Freq: Once | INTRAVENOUS | Status: AC
  Administered 2021-12-20: 600 mg via INTRAVENOUS
  Filled 2021-12-20: qty 50

## 2021-12-20 NOTE — Patient Instructions (Addendum)
Rituximab Injection ??Qu? es [employer]? ?El RITUXIMAB es un anticuerpo monoclonal. Se utiliza para tratar ciertos tipos de c?ncer, tales como el linfoma no Hodgkin y la leucemia linfoc?tica cr?nica. Tambi?n se [place] para tratar la artritis reumatoide, la poliangitis granulomatosa, la poliangitis microsc?pica y el p?nfigo vulgar. ?Este medicamento puede ser utilizado para otros usos; si tiene alguna pregunta consulte con su proveedor de atenci?n m?dica o con su farmac?utico. ?MARCAS COMUNES: RIABNI, Rituxan, RUXIENCE ??Qu? le debo informar a mi profesional de la salud antes de tomar este medicamento? ?Necesitan saber si usted presenta alguno de los siguientes problemas o situaciones: ?dolor en el pecho ?enfermedad cardiaca ?infecci?n, especialmente una infecci?n viral, tales como varicela, fuegos labiales, hepatitis B o herpes ?problemas del sistema inmunol?gico ?ritmo o frecuencia cardiaca irregular ?enfermedad renal ?recuentos sangu?neos bajos (gl?bulos blancos, plaquetas o gl?bulos rojos) ?enfermedad pulmonar ?vacuna reciente o programada ?una reacci?n al?rgica o inusual al rituximab, a otros medicamentos, alimentos, colorantes o conservantes ?si est? embarazada o buscando quedar embarazada ?si est? amamantando a un beb? ??C?mo debo utilizar este medicamento? ?Este medicamento se inyecta en una vena. Lo administra un proveedor de atenci?n m?dica en un hospital o en un entorno cl?nico. ?Se le entregar? una Gu?a del medicamento (MedGuide, su nombre en ingl?s) especial antes de cada tratamiento. Aseg?rese de leer esta informaci?n cada vez cuidadosamente. ?Hable con su proveedor de atenci?n m?dica sobre el uso de este medicamento en ni?os. Aunque este medicamento se puede recetar a ni?os tan peque?os como de 6 meses de edad con ciertas afecciones, existen precauciones que deben tomarse. ?Sobredosis: P?ngase en contacto inmediatamente con un centro toxicol?gico o una sala de urgencia si usted cree que haya tomado  demasiado medicamento. ?ATENCI?N: [employer] es solo para usted. No comparta este medicamento con nadie. ??Qu? sucede si me olvido de una dosis? ?Cumpla con las citas para dosis de seguimiento. Es importante no olvidar ninguna dosis. Llame a su proveedor de atenci?n m?dica si no puede asistir a una cita. ??Qu? puede interactuar con este medicamento? ?No use esta medicina con ninguno de los siguientes medicamentos: ?vacunas vivas ?Esta medicina tambi?n puede interactuar con los siguientes medicamentos: ?cisplatino ?Puede ser que esta lista no menciona todas las posibles interacciones. Informe a su profesional de la salud de [employer] productos a base de hierbas, medicamentos de venta libre o suplementos nutritivos que est? tomando. Si usted fuma, consume bebidas alcoh?licas o si utiliza drogas ilegales, ind?queselo tambi?n a su profesional de [employer]	[place]. Algunas sustancias pueden interactuar con su medicamento. ??A qu? debo estar atento al [employer]? ?Se supervisar? su estado de salud atentamente mientras reciba este medicamento. Usted podr?a necesitar realizarse an?lisis de Environmental health practitioner est? [employer]. ?Este medicamento puede causar reacciones graves a la infusi?n. Para reducir Catering manager, su proveedor de atenci?n m?dica podr?a darle otros medicamentos que deber? usar antes de Health and safety inspector. Aseg?rese de seguir las instrucciones de su proveedor de atenci?n m?dica. ?Este medicamento puede aumentar su riesgo de Museum/gallery curator una infecci?n. Consulte con su proveedor de atenci?n m?dica si tiene fiebre, escalofr?os, dolor de garganta, o cualquier otro s?ntoma de resfr?o o gripe. No se trate usted mismo. Trate de no acercarse a personas que est?n enfermas. ?Llame a su proveedor de atenci?n m?dica si est? expuesto a cualquier persona con sarampi?n o varicela, o si desarrolla llagas o ampollas que no sanan correctamente. ?Evite usar medicamentos que contienen aspirina, acetaminofeno, ibuprofeno,  naproxeno o ketoprofeno, a menos que as? lo indique su proveedor de atenci?n m?dica. Estos productos  pueden ocultar la fiebre. ?Este medicamento puede causar reacciones graves en la piel. Pueden suceder semanas a meses despu?s de comenzar a Environmental consultant. Contacte a su proveedor de atenci?n m?dica de inmediato si nota que tiene fiebre o s?ntomas gripales con una erupci?n. La erupci?n puede ser roja o Phill Myron, y luego puede convertirse en ampollas o descamaci?n de la piel. O bien, es posible que observe una erupci?n roja con hinchaz?n en la cara, los labios o los ganglios linf?ticos en el cuello o debajo de los brazos. ?En algunos pacientes, este medicamento puede causar una infecci?n grave en el cerebro que puede llevar a la muerte. Si tiene cualquier problema para ver, pensar, hablar, caminar o pararse, inf?rmeselo a su profesional de [employer]	[place] de inmediato. Si no puede ponerse en contacto con su profesional de la salud, busque otra fuente de atenci?n m?dica de [place] urgente. ?No debe quedar embarazada mientras est? [employer], o al menos por 12 meses despu?s de dejar de usarlo. Las mujeres deben informar a su proveedor de atenci?n m?dica si est?n buscando quedar embarazadas o si creen que podr?an estar embarazadas. Existe la posibilidad de da?os graves en un beb? sin nacer. Para obtener m?s informaci?n, hable con su proveedor de atenci?n m?dica. Las mujeres deben usar un m?todo anticonceptivo confiable mientras est?n recibiendo [employer] y durante 12 meses despu?s de dejar de usarlo. No debe amamantar a un beb? mientras est? tomando [employer], o al menos por 6 meses despu?s de dejar de usarlo. ??Qu? efectos secundarios puedo tener al [employer] medicamento? ?Efectos secundarios que debe informar a su proveedor de atenci?n m?dica tan pronto como sea posible: ?reacciones al?rgicas (erupci?n cut?nea, comez?n/picaz?n o urticaria; hinchaz?n de la cara, los labios o la  [place]) ?diarrea ?edema (aumento repentino de peso; hinchaz?n de los tobillos, los pies, las manos u otra hinchaz?n inusual; dificultad para Ambulance person) ?frecuencia cardiaca r?pida e irregular ?ataque cardiaco (problemas para respirar; dolor u opresi?n en el pecho, cuello, espalda o brazos; debilidad o cansancio inusuales) ?infecci?n (fiebre, escalofr?os, tos, dolor de garganta, dolor o dificultad para orinar) ?lesi?n renal (dificultad para orinar o cambios en la cantidad de [place]) ?lesi?n en el h?gado (orina amarilla oscura o marr?n; sensaci?n general de estar enfermo o s?ntomas gripales; p?rdida del apetito; dolor en la regi?n abdominal superior derecha; debilidad o cansancio inusuales; color amarillento de los ojos o la piel) ?presi?n arterial baja (mareos; sensaci?n de desmayo o aturdimiento; ca?das; debilidad o cansancio inusuales) ?recuentos bajos de gl?bulos rojos (dificultad para respirar; sensaci?n de desmayo; aturdimiento; ca?das; debilidad o cansancio inusuales) ?llagas en la boca ?enrojecimiento, formaci?n de ampollas, descamaci?n o distensi?n de la piel, incluso dentro de la boca ?dolor estomacal ?sangrado o moretones inusuales ?sibilancias (dificultad para respirar acompa?ada de sonidos fuertes o silbidos) ?v?mito ?Efectos secundarios que generalmente no requieren atenci?n m?dica (debe informarlos a su proveedor de atenci?n m?dica si persisten o si son molestos): ?dolor de cabeza ?dolor en las articulaciones ?calambres, dolores musculares ?n?useas ?Puede ser que esta lista no menciona todos los posibles efectos secundarios. Comun?quese a su m?dico por asesoramiento m?dico [employer] secundarios. Usted puede informar los efectos secundarios a la FDA por tel?fono al 1-800-FDA-1088. ??D?nde debo guardar mi medicina? ?Este medicamento se administra en hospitales o cl?nicas. No se guarda en su casa. ?ATENCI?N: Este folleto es un resumen. Puede ser que no cubra toda la posible informaci?n. Si usted  tiene preguntas acerca de esta medicina, consulte con su m?dico, su farmac?utico o su profesional de Technical sales engineer. ??  2022 Elsevier/Gold Standard (2021-04-24 00:00:00) ?[facility] [place]  Discharge

## 2021-12-20 NOTE — Progress Notes (Signed)
Pharmacist Chemotherapy Monitoring - Initial Assessment   ? ?Anticipated start date: 12/27/21  ? ?The following has been reviewed per standard work regarding the patient's treatment regimen: ?The patient's diagnosis, treatment plan and drug doses, and organ/hematologic function ?Lab orders and baseline tests specific to treatment regimen  ?The treatment plan start date, drug sequencing, and pre-medications ?Prior authorization status  ?Patient's documented medication list, including drug-drug interaction screen and prescriptions for anti-emetics and supportive care specific to the treatment regimen ?The drug concentrations, fluid compatibility, administration routes, and timing of the medications to be used ?The patient's access for treatment and lifetime cumulative dose history, if applicable  ?The patient's medication allergies and previous infusion related reactions, if applicable  ? ?Changes made to treatment plan:  ?treatment plan date ? ?Follow up needed:  ?N/A ? ? ?Charles Bowers, [facility], ?12/20/2021  8:21 AM  ?

## 2021-12-22 ENCOUNTER — Encounter: Payer: Self-pay | Admitting: Oncology

## 2021-12-22 DIAGNOSIS — Z7189 Other specified counseling: Secondary | ICD-10-CM | POA: Insufficient documentation

## 2021-12-24 ENCOUNTER — Encounter: Payer: Self-pay | Admitting: Licensed Clinical Social Worker

## 2021-12-24 NOTE — Progress Notes (Signed)
[facility] Clinical Social Work  ?Initial Assessment ? ? ?Charles Bowers Charles Bowers is a 51 y.o. year old male contacted by phone. Clinical Social Work was referred by medical provider for assessment of psychosocial needs.  ? ?SDOH (Social Determinants of Health) assessments performed: Yes ?  ?Distress Screen completed: No ?ONCBCN DISTRESS SCREENING 11/25/2021  ?Screening Type Initial Screening  ?Distress experienced in past week (1-10) 7  ? ? ? ? ?Family/Social Information:  ?Housing Arrangement: patient lives with spouse  and sons. ?Family members/support persons in your life? Family, Medical Staff, and Friends/Colleagues ?Transportation concerns: no  ?Employment: Unemployed and Out of work due to pain. Income source: No income ?Financial concerns: Yes, due to illness and/or loss of work during treatment ?Type of concern: Medical bills ?Food access concerns: no ?Religious or spiritual practice: no ?Services Currently in place:  Financial navigator referral made ? ?Coping/ Adjustment to diagnosis: ?Patient understands treatment plan and what happens next? yes ?Concerns about diagnosis and/or treatment: Pain or discomfort during procedures, How I will care for other members of my family, Losing my job, Software engineer by information, and How I will pay for the services I need ?Patient reported stressors: Work/ school, Finances, Anxiety, and Adjusting to my illness ?Hopes and priorities: N/A ?Patient enjoys time with family/ friends ?Current coping skills/ strengths: Ability for insight , Active sense of humor , Average or above average intelligence , Capable of independent living , Communication skills , Motivation for treatment/growth , and Supportive family/friends  ? ? ? SUMMARY: ?Current SDOH Barriers:  ?Financial constraints related to lack of income due to unemployment, Level of care concerns, and Medication procurement. Patient does not qualify for any state or federal programs. ? ?Clinical Social Work Clinical Goal(s):   ?patient will follow up with An[facility]ator* as directed by SW ? ?Interventions: ?Discussed common feeling and emotions when being diagnosed with cancer, and the importance of support during treatment ?Informed patient of the support team roles and support services at [facility] ?Provided CSW contact information and encouraged patient to call with any questions or concerns ?Provided patient with information about CSW role in patient care, and available resources. ? ? ?Follow Up Plan: Patient will contact CSW with any support or resource needs ?Patient verbalizes understanding of plan: Yes ? ? ? ?Charles Landeck, LCSW ?

## 2021-12-27 ENCOUNTER — Inpatient Hospital Stay (HOSPITAL_BASED_OUTPATIENT_CLINIC_OR_DEPARTMENT_OTHER): Payer: Self-pay | Admitting: Oncology

## 2021-12-27 ENCOUNTER — Inpatient Hospital Stay: Payer: Self-pay

## 2021-12-27 ENCOUNTER — Other Ambulatory Visit: Payer: Self-pay

## 2021-12-27 ENCOUNTER — Encounter: Payer: Self-pay | Admitting: Oncology

## 2021-12-27 VITALS — BP 123/81 | HR 67 | Temp 96.2°F | Resp 17

## 2021-12-27 VITALS — BP 104/70 | HR 61 | Temp 97.9°F | Resp 16 | Wt 139.5 lb

## 2021-12-27 DIAGNOSIS — Z79620 Long term (current) use of immunosuppressive biologic: Secondary | ICD-10-CM

## 2021-12-27 DIAGNOSIS — C884 Extranodal marginal zone B-cell lymphoma of mucosa-associated lymphoid tissue [MALT-lymphoma]: Secondary | ICD-10-CM

## 2021-12-27 DIAGNOSIS — Z5181 Encounter for therapeutic drug level monitoring: Secondary | ICD-10-CM

## 2021-12-27 LAB — COMPREHENSIVE METABOLIC PANEL
ALT: 22 U/L (ref 0–44)
AST: 24 U/L (ref 15–41)
Albumin: 3.9 g/dL (ref 3.5–5.0)
Alkaline Phosphatase: 65 U/L (ref 38–126)
Anion gap: 6 (ref 5–15)
BUN: 13 mg/dL (ref 6–20)
CO2: 26 mmol/L (ref 22–32)
Calcium: 8.8 mg/dL — ABNORMAL LOW (ref 8.9–10.3)
Chloride: 103 mmol/L (ref 98–111)
Creatinine, Ser: 1.07 mg/dL (ref 0.61–1.24)
GFR, Estimated: >60 mL/min (ref >60)
Glucose, Bld: 135 mg/dL — ABNORMAL HIGH (ref 70–99)
Potassium: 3.6 mmol/L (ref 3.5–5.1)
Sodium: 135 mmol/L (ref 135–145)
Total Bilirubin: 0.5 mg/dL (ref 0.3–1.2)
Total Protein: 8.1 g/dL (ref 6.5–8.1)

## 2021-12-27 LAB — CBC WITH DIFFERENTIAL/PLATELET
Abs Immature Granulocytes: 0.01 10*3/uL (ref 0.00–0.07)
Basophils Absolute: 0 10*3/uL (ref 0.0–0.1)
Basophils Relative: 1 %
Eosinophils Absolute: 0.8 10*3/uL — ABNORMAL HIGH (ref 0.0–0.5)
Eosinophils Relative: 17 %
HCT: 36.7 % — ABNORMAL LOW (ref 39.0–52.0)
Hemoglobin: 12.7 g/dL — ABNORMAL LOW (ref 13.0–17.0)
Immature Granulocytes: 0 %
Lymphocytes Relative: 27 %
Lymphs Abs: 1.2 10*3/uL (ref 0.7–4.0)
MCH: 31.2 pg (ref 26.0–34.0)
MCHC: 34.6 g/dL (ref 30.0–36.0)
MCV: 90.2 fL (ref 80.0–100.0)
Monocytes Absolute: 0.4 10*3/uL (ref 0.1–1.0)
Monocytes Relative: 8 %
Neutro Abs: 2.1 10*3/uL (ref 1.7–7.7)
Neutrophils Relative %: 47 %
Platelets: 244 10*3/uL (ref 150–400)
RBC: 4.07 MIL/uL — ABNORMAL LOW (ref 4.22–5.81)
RDW: 11.9 % (ref 11.5–15.5)
WBC: 4.4 10*3/uL (ref 4.0–10.5)
nRBC: 0 % (ref 0.0–0.2)

## 2021-12-27 LAB — HEPATITIS B SURFACE ANTIGEN: Hepatitis B Surface Ag: NONREACTIVE

## 2021-12-27 MED ORDER — ACETAMINOPHEN 325 MG PO TABS
650.0000 mg | ORAL_TABLET | Freq: Once | ORAL | Status: AC
  Administered 2021-12-27: 650 mg via ORAL
  Filled 2021-12-27: qty 2

## 2021-12-27 MED ORDER — SODIUM CHLORIDE 0.9 % IV SOLN
Freq: Once | INTRAVENOUS | Status: AC
  Filled 2021-12-27: qty 250

## 2021-12-27 MED ORDER — SODIUM CHLORIDE 0.9 % IV SOLN
375.0000 mg/m2 | Freq: Once | INTRAVENOUS | Status: AC
  Administered 2021-12-27: 600 mg via INTRAVENOUS
  Filled 2021-12-27: qty 50

## 2021-12-27 MED ORDER — SODIUM CHLORIDE 0.9 % IV SOLN: 375.0000 mg/m2 | Freq: Once | INTRAVENOUS | Status: DC

## 2021-12-27 MED ORDER — DIPHENHYDRAMINE HCL 25 MG PO CAPS
50.0000 mg | ORAL_CAPSULE | Freq: Once | ORAL | Status: AC
  Administered 2021-12-27: 50 mg via ORAL
  Filled 2021-12-27: qty 2

## 2021-12-27 NOTE — Progress Notes (Signed)
? ? ? ?Hematology/Oncology Consult note ?[name]  ?[phone] [phone] ? ?Patient Care Team: ?Pcp, No as PCP - General ?Sindy Guadeloupe, MD as Consulting Physician (Oncology) ?Clyde Canterbury, MD as Referring Physician (Otolaryngology)  ? ?Name of the patient: Charles Bowers  ?[id number]  ?Aug 26, 1971  ? ?Date of visit: 12/27/21 ? 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 ?Chief complaint/ Reason for visit-on treatment assessment prior to cycle 2 of weekly Rituxan chemotherapy ? ?Heme/Onc history: [name] and history obtained with the help ofSpanish interpreter.  He does not have any known medical problems. He was noted to have swelling involving his right cheek that has been ongoing for the last 2 to 3 months.  Patient is here with his son today who reports that he feels that patient has lost a lot of weight in the last 3 months.  Patient endorses that his appetite is poor.  Denies any significant pain. ?  ?Patient was seen by ENT and underwent CT soft tissue neck which showed an enhancing lesion in the soft palate concerning for primary malignancy.  Bulky lymphadenopathy in the right neck with large mass centered in the right parotid gland multiple enlarged right cervical chain lymph nodes measuring up to 2 cm in size.  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  Ki-67 10 to 20%.  Overall immunoreactivity supports a differential diagnosis of extranodal marginal zone lymphoma versus lymphoplasmacytic lymphoma.  NYD 88 testing on the biopsy specimen was negative. ? ?Plan is for 4 weekly cycles of Rituxan followed by repeat PET ? ?Interval history-tolerated cycle 1 of Rituxan well without any significant side effects.  Does note reduction in the  size of his right parotid mass.  Reports that his appetite is good.  Denies any specific complaints at this time.  History obtained with the help of Spanish interpreter. ? ?ECOG PS- 0 ?Pain scale- 0 ? ? ?Review of systems- Review of Systems  ?Constitutional:  Negative for chills, fever, malaise/fatigue and weight loss.  ?HENT:  Negative for congestion, ear discharge and nosebleeds.   ?Eyes:  Negative for blurred vision.  ?Respiratory:  Negative for cough, hemoptysis, sputum production, shortness of breath and wheezing.   ?Cardiovascular:  Negative for chest pain, palpitations, orthopnea and claudication.  ?Gastrointestinal:  Negative for abdominal pain, blood in stool, constipation, diarrhea, heartburn, melena, nausea and vomiting.  ?Genitourinary:  Negative for dysuria, flank pain, frequency, hematuria and urgency.  ?Musculoskeletal:  Negative for back pain, joint pain and myalgias.  ?Skin:  Negative for rash.  ?Neurological:  Negative for dizziness, tingling, focal weakness, seizures, weakness and headaches.  ?Endo/Heme/Allergies:  Does not bruise/bleed easily.  ?Psychiatric/Behavioral:  Negative for depression and suicidal ideas. The patient does not have insomnia.    ? ? ? ?No Known Allergies ? ? ?Past Medical History:  ?Diagnosis Date  ? History of COVID-19 2020  ? Lymphoma ([place]) 10/30/2021  ? low grade b cell lymphoma  ? Mass of right parotid gland   ? ? ? ?History reviewed. No pertinent surgical history. ? ?Social History  ? ?Socioeconomic History  ? Marital status: Married  ?  Spouse name: Not on file  ? Number of children: Not on file  ? Years of education: Not on file  ? Highest education level: Not on  file  ?Occupational History  ? Not on file  ?Tobacco Use  ? Smoking status: Never  ?  Passive exposure: Never  ? Smokeless tobacco: Never  ?Vaping Use  ? Vaping Use: Never used  ?Substance and Sexual Activity  ? Alcohol use: Never  ? Drug use: Never  ? Sexual activity: Yes  ?Other Topics Concern  ? Not on  file  ?Social History Narrative  ? Not on file  ? ?Social Determinants of Health  ? ?Financial Resource Strain: Medium Risk  ? Difficulty of Paying Living Expenses: Somewhat hard  ?Food Insecurity: No Food Insecurity  ? Worried About Charity fundraiser in the Last Year: Never true  ? Ran Out of Food in the Last Year: Never true  ?Transportation Needs: No Transportation Needs  ? Lack of Transportation (Medical): No  ? Lack of Transportation (Non-Medical): No  ?Physical Activity: Inactive  ? Days of Exercise per Week: 0 days  ? Minutes of Exercise per Session: 0 min  ?Stress: Stress Concern Present  ? Feeling of Stress : To some extent  ?Social Connections: Moderately Isolated  ? Frequency of Communication with Friends and Family: Three times a week  ? Frequency of Social Gatherings with Friends and Family: Three times a week  ? Attends Religious Services: Never  ? Active Member of Clubs or Organizations: No  ? Attends Archivist Meetings: Never  ? Marital Status: Married  ?Intimate Partner Violence: Not At Risk  ? Fear of Current or Ex-Partner: No  ? Emotionally Abused: No  ? Physically Abused: No  ? Sexually Abused: No  ? ? ?History reviewed. No pertinent family history. ? ? ?Current Outpatient Medications:  ?  acetaminophen (TYLENOL) 500 MG tablet, Take 500 mg by mouth daily. (Patient not taking: Reported on 12/27/2021), Disp: , Rfl:  ? ?Physical exam:  ?Vitals:  ? 12/27/21 0835  ?BP: 104/70  ?Pulse: 61  ?Resp: 16  ?Temp: 97.9 ?F (36.6 ?C)  ?SpO2: 95%  ?Weight: 139 lb 8 oz (63.3 kg)  ? ?Physical Exam ?Constitutional:   ?   General: He is not in acute distress. ?Cardiovascular:  ?   Rate and Rhythm: Normal rate and regular rhythm.  ?   Heart sounds: Normal heart sounds.  ?Pulmonary:  ?   Effort: Pulmonary effort is normal.  ?   Breath sounds: Normal breath sounds.  ?Abdominal:  ?   General: Bowel sounds are normal.  ?   Palpations: Abdomen is soft.  ?Lymphadenopathy:  ?   Comments: No palpable right  parotid swelling or cervical adenopathy  ?Skin: ?   General: Skin is warm and dry.  ?Neurological:  ?   Mental Status: He is alert and oriented to person, place, and time.  ?  ? ? ?  Latest Ref Rng & Units 12/27/2021  ?  8:08 AM  ?CMP  ?Glucose 70 - 99 mg/dL 135    ?BUN 6 - 20 mg/dL 13    ?Creatinine 0.61 - 1.24 mg/dL 1.07    ?Sodium 135 - 145 mmol/L 135    ?Potassium 3.5 - 5.1 mmol/L 3.6    ?Chloride 98 - 111 mmol/L 103    ?CO2 22 - 32 mmol/L 26    ?Calcium 8.9 - 10.3 mg/dL 8.8    ?Total Protein 6.5 - 8.1 g/dL 8.1    ?Total Bilirubin 0.3 - 1.2 mg/dL 0.5    ?Alkaline Phos 38 - 126 U/L 65    ?AST 15 - 41 U/L  24    ?ALT 0 - 44 U/L 22    ? ? ?  Latest Ref Rng & Units 12/27/2021  ?  8:08 AM  ?CBC  ?WBC 4.0 - 10.5 K/uL 4.4    ?Hemoglobin 13.0 - 17.0 g/dL 12.7    ?Hematocrit 39.0 - 52.0 % 36.7    ?Platelets 150 - 400 K/uL 244    ? ? ?No images are attached to the encounter. ? ?NM PET Image Initial (PI) Skull Base To Thigh ? ?Result Date: 12/08/2021 ?CLINICAL DATA:  Initial treatment strategy for extranodal marginal zone B-cell lymphoma. EXAM: NUCLEAR MEDICINE PET SKULL BASE TO THIGH TECHNIQUE: 7.2 mCi F-18 FDG was injected intravenously. Full-ring PET imaging was performed from the skull base to thigh after the radiotracer. CT data was obtained and used for attenuation correction and anatomic localization. Fasting blood glucose: 100 mg/dl COMPARISON:  10/03/2021 neck CT. FINDINGS: Mediastinal blood pool activity: SUV max 1.8 Liver activity: SUV max 2.9 NECK: Hypermetabolic solid 2.9 x 2.1 cm right parotid mass with max SUV 4.1 (series 4/image 28), mildly decreased from 3.7 x 2.7 cm on 10/03/2021 CT. A few mildly enlarged mildly hypermetabolic high right level 2 neck lymph nodes, largest 1.2 cm short axis diameter with max SUV 4.3 (series 4/image 35), mildly decreased from 1.4 cm. Mildly hypermetabolic 1.7 x 0.9 cm soft tissue nodule anterior to the right parotid gland with max SUV 3.9 (series 4/image 28), minimally decreased  from 1.8 x 0.9 cm. Mildly hypermetabolic 0.9 cm left level 2 neck lymph node with max SUV 2.7 (series 4/image 35), stable in size. Incidental CT findings: none CHEST: No enlarged or hypermetabolic axillary, m

## 2021-12-27 NOTE — Progress Notes (Signed)
Nutrition Assessment: ? ?Patient with weight loss and poor appetite ? ?51 year old male with extranodal marginal zone lymphoma.  Past medical history of COVID.  Patient receiving rituxan.   ? ?Met with patient today during infusion.  Used [facility] with interpreter [place] [phone] interpreting during entire visit.  Patient reports that he thinks appetite is pretty good.  Usually eats bread and coffee around 10am and hour later has some tortillas.  Around 3pm has lunch and 10pm dinner.  Usually consists of mostly vegetables, beans, rice, some meat but not a lot.  Does report trouble chewing due to dentition.  Had dental work about 1 year ago.  Denies trouble swallowing.  Has not tried oral nutrition supplements.  Wife makes him shakes at home with fruit and vegetables.   ? ? ? ?Medications: reviewed ? ?Labs: glucose 135 ? ?Anthropometrics:  ? ?Height: 66 inches ?Weight: 139 lb 8 oz today ?UBW: 158 lb about 8 months ago ?BMI: 22 ? ?12% weight loss in the last 8 months, concerning ? ? ?Estimated Energy Needs ? ?Kcals: 1575-1900 ?Protein: 78-95 g ?Fluid: > 1.5 L ? ?NUTRITION DIAGNOSIS:  Unintentional weight loss related to cancer, dental work as evidenced by 12% weight loss in the last 8 months, likely effecting intake. ? ? ? ?INTERVENTION:  ?Discussed importance of nutrition during treatment and weight maintenance.  ?Encouraged good sources or protein at every meal.  Examples of foods with protein discussed.  ?Provided patient booklet in Spanish regarding nutrition during treatment.   ?Provided patient samples of ensure complete with coupons for him to try.  ?Contact information provided ?  ? ?MONITORING, EVALUATION, GOAL: weight trends, intake ? ? ?NEXT VISIT: to be determined ? ?Christinna Sprung B. Zenia Resides, RD, LDN ?Registered Dietitian ?[phone] ? ? ?

## 2021-12-27 NOTE — Patient Instructions (Signed)
[facility] AT [place]  Discharge Instructions: ?Thank you for choosing [facility] to provide your oncology and hematology care.  ?If you have a lab appointment with the [facility], please go directly to the [facility] and check in at the registration area. ? ?Wear comfortable clothing and clothing appropriate for easy access to any Portacath or PICC line.  ? ?We strive to give you quality time with your provider. You may need to reschedule your appointment if you arrive late (15 or more minutes).  Arriving late affects you and other patients whose appointments are after yours.  Also, if you miss three or more appointments without notifying the office, you may be dismissed from the clinic at the provider?s discretion.    ?  ?For prescription refill requests, have your pharmacy contact our office and allow 72 hours for refills to be completed.   ? ?Today you received the following chemotherapy and/or immunotherapy agents Rituximab     ?  ?To help prevent nausea and vomiting after your treatment, we encourage you to take your nausea medication as directed. ? ?BELOW ARE SYMPTOMS THAT SHOULD BE REPORTED IMMEDIATELY: ?*FEVER GREATER THAN 100.4 F (38 ?C) OR HIGHER ?*CHILLS OR SWEATING ?*NAUSEA AND VOMITING THAT IS NOT CONTROLLED WITH YOUR NAUSEA MEDICATION ?*UNUSUAL SHORTNESS OF BREATH ?*UNUSUAL BRUISING OR BLEEDING ?*URINARY PROBLEMS (pain or burning when urinating, or frequent urination) ?*BOWEL PROBLEMS (unusual diarrhea, constipation, pain near the anus) ?TENDERNESS IN MOUTH AND THROAT WITH OR WITHOUT PRESENCE OF ULCERS (sore throat, sores in mouth, or a toothache) ?UNUSUAL RASH, SWELLING OR PAIN  ?UNUSUAL VAGINAL DISCHARGE OR ITCHING  ? ?Items with * indicate a potential emergency and should be followed up as soon as possible or go to the Emergency Department if any problems should occur. ? ?Please show the CHEMOTHERAPY ALERT CARD or IMMUNOTHERAPY ALERT CARD at check-in to  the Emergency Department and triage nurse. ? ?Should you have questions after your visit or need to cancel or reschedule your appointment, please contact [facility] [place] AT [place]  [phone] and follow the prompts.  Office hours are 8:00 a.m. to 4:30 p.m. Monday - Friday. Please note that voicemails left after 4:00 p.m. may not be returned until the following business day.  We are closed weekends and major holidays. You have access to a nurse at all times for urgent questions. Please call the main number to the clinic [phone] and follow the prompts. ? ?For any non-urgent questions, you may also contact your provider using MyChart. We now offer e-Visits for anyone 65 and older to request care online for non-urgent symptoms. For details visit [email or web address]. ?  ?Also download the MyChart app! Go to the app store, search "MyChart", open the app, select , and log in with your MyChart username and password. ? ?Due to Covid, a mask is required upon entering the hospital/clinic. If you do not have a mask, one will be given to you upon arrival. For doctor visits, patients may have 1 support person aged 37 or older with them. For treatment visits, patients cannot have anyone with them due to current Covid guidelines and our immunocompromised population.  ?

## 2021-12-27 NOTE — Progress Notes (Signed)
Pt states that he has not been feeling pain lately untill yesterday around his knees and ankles.  ?

## 2022-01-03 ENCOUNTER — Inpatient Hospital Stay: Payer: Self-pay

## 2022-01-03 VITALS — BP 103/68 | HR 52 | Temp 97.4°F

## 2022-01-03 DIAGNOSIS — C884 Extranodal marginal zone B-cell lymphoma of mucosa-associated lymphoid tissue [MALT-lymphoma]: Secondary | ICD-10-CM

## 2022-01-03 LAB — CBC WITH DIFFERENTIAL/PLATELET
Abs Immature Granulocytes: 0.01 10*3/uL (ref 0.00–0.07)
Basophils Absolute: 0 10*3/uL (ref 0.0–0.1)
Basophils Relative: 1 %
Eosinophils Absolute: 0.4 10*3/uL (ref 0.0–0.5)
Eosinophils Relative: 10 %
HCT: 35.7 % — ABNORMAL LOW (ref 39.0–52.0)
Hemoglobin: 12.5 g/dL — ABNORMAL LOW (ref 13.0–17.0)
Immature Granulocytes: 0 %
Lymphocytes Relative: 29 %
Lymphs Abs: 1.2 10*3/uL (ref 0.7–4.0)
MCH: 31.2 pg (ref 26.0–34.0)
MCHC: 35 g/dL (ref 30.0–36.0)
MCV: 89 fL (ref 80.0–100.0)
Monocytes Absolute: 0.4 10*3/uL (ref 0.1–1.0)
Monocytes Relative: 10 %
Neutro Abs: 2.1 10*3/uL (ref 1.7–7.7)
Neutrophils Relative %: 50 %
Platelets: 242 10*3/uL (ref 150–400)
RBC: 4.01 MIL/uL — ABNORMAL LOW (ref 4.22–5.81)
RDW: 11.8 % (ref 11.5–15.5)
WBC: 4.2 10*3/uL (ref 4.0–10.5)
nRBC: 0 % (ref 0.0–0.2)

## 2022-01-03 LAB — COMPREHENSIVE METABOLIC PANEL
ALT: 26 U/L (ref 0–44)
AST: 23 U/L (ref 15–41)
Albumin: 3.8 g/dL (ref 3.5–5.0)
Alkaline Phosphatase: 73 U/L (ref 38–126)
Anion gap: 5 (ref 5–15)
BUN: 16 mg/dL (ref 6–20)
CO2: 24 mmol/L (ref 22–32)
Calcium: 8.7 mg/dL — ABNORMAL LOW (ref 8.9–10.3)
Chloride: 106 mmol/L (ref 98–111)
Creatinine, Ser: 0.95 mg/dL (ref 0.61–1.24)
GFR, Estimated: >60 mL/min (ref >60)
Glucose, Bld: 119 mg/dL — ABNORMAL HIGH (ref 70–99)
Potassium: 3.4 mmol/L — ABNORMAL LOW (ref 3.5–5.1)
Sodium: 135 mmol/L (ref 135–145)
Total Bilirubin: 0.4 mg/dL (ref 0.3–1.2)
Total Protein: 7.6 g/dL (ref 6.5–8.1)

## 2022-01-03 MED ORDER — DIPHENHYDRAMINE HCL 25 MG PO CAPS
50.0000 mg | ORAL_CAPSULE | Freq: Once | ORAL | Status: AC
  Administered 2022-01-03: 50 mg via ORAL
  Filled 2022-01-03: qty 2

## 2022-01-03 MED ORDER — SODIUM CHLORIDE 0.9 % IV SOLN
Freq: Once | INTRAVENOUS | Status: AC
  Filled 2022-01-03: qty 250

## 2022-01-03 MED ORDER — ACETAMINOPHEN 325 MG PO TABS
650.0000 mg | ORAL_TABLET | Freq: Once | ORAL | Status: AC
  Administered 2022-01-03: 650 mg via ORAL
  Filled 2022-01-03: qty 2

## 2022-01-03 MED ORDER — SODIUM CHLORIDE 0.9 % IV SOLN
375.0000 mg/m2 | Freq: Once | INTRAVENOUS | Status: AC
  Administered 2022-01-03: 600 mg via INTRAVENOUS
  Filled 2022-01-03: qty 50

## 2022-01-03 NOTE — Patient Instructions (Signed)
[facility] AT [place]  Discharge Instructions: ?Thank you for choosing [facility] to provide your oncology and hematology care.  ?If you have a lab appointment with the Cancer Center, please go directly to the Cancer Center and check in at the registration area. ? ?Wear comfortable clothing and clothing appropriate for easy access to any Portacath or PICC line.  ? ?We strive to give you quality time with your provider. You may need to reschedule your appointment if you arrive late (15 or more minutes).  Arriving late affects you and other patients whose appointments are after yours.  Also, if you miss three or more appointments without notifying the office, you may be dismissed from the clinic at the provider?s discretion.    ?  ?For prescription refill requests, have your pharmacy contact our office and allow 72 hours for refills to be completed.   ? ?Today you received the following chemotherapy and/or immunotherapy agents Rituxan  ?  ?To help prevent nausea and vomiting after your treatment, we encourage you to take your nausea medication as directed. ? ?BELOW ARE SYMPTOMS THAT SHOULD BE REPORTED IMMEDIATELY: ?*FEVER GREATER THAN 100.4 F (38 ?C) OR HIGHER ?*CHILLS OR SWEATING ?*NAUSEA AND VOMITING THAT IS NOT CONTROLLED WITH YOUR NAUSEA MEDICATION ?*UNUSUAL SHORTNESS OF BREATH ?*UNUSUAL BRUISING OR BLEEDING ?*URINARY PROBLEMS (pain or burning when urinating, or frequent urination) ?*BOWEL PROBLEMS (unusual diarrhea, constipation, pain near the anus) ?TENDERNESS IN MOUTH AND THROAT WITH OR WITHOUT PRESENCE OF ULCERS (sore throat, sores in mouth, or a toothache) ?UNUSUAL RASH, SWELLING OR PAIN  ?UNUSUAL VAGINAL DISCHARGE OR ITCHING  ? ?Items with * indicate a potential emergency and should be followed up as soon as possible or go to the Emergency Department if any problems should occur. ? ?Please show the CHEMOTHERAPY ALERT CARD or IMMUNOTHERAPY ALERT CARD at check-in to the  Emergency Department and triage nurse. ? ?Should you have questions after your visit or need to cancel or reschedule your appointment, please contact [facility] AT [place]  [phone] and follow the prompts.  Office hours are 8:00 a.m. to 4:30 p.m. Monday - Friday. Please note that voicemails left after 4:00 p.m. may not be returned until the following business day.  We are closed weekends and major holidays. You have access to a nurse at all times for urgent questions. Please call the main number to the clinic [phone] and follow the prompts. ? ?For any non-urgent questions, you may also contact your provider using MyChart. We now offer e-Visits for anyone 18 and older to request care online for non-urgent symptoms. For details visit [email or web address]. ?  ?Also download the MyChart app! Go to the app store, search "MyChart", open the app, select [facility], and log in with your MyChart username and password. ? ?Due to Covid, a mask is required upon entering the hospital/clinic. If you do not have a mask, one will be given to you upon arrival. For doctor visits, patients may have 1 support person aged 18 or older with them. For treatment visits, patients cannot have anyone with them due to current Covid guidelines and our immunocompromised population.  ?

## 2022-01-10 ENCOUNTER — Inpatient Hospital Stay (HOSPITAL_BASED_OUTPATIENT_CLINIC_OR_DEPARTMENT_OTHER): Payer: Self-pay | Admitting: Oncology

## 2022-01-10 ENCOUNTER — Encounter: Payer: Self-pay | Admitting: Oncology

## 2022-01-10 ENCOUNTER — Inpatient Hospital Stay: Payer: Self-pay | Attending: Oncology

## 2022-01-10 ENCOUNTER — Inpatient Hospital Stay: Payer: Self-pay

## 2022-01-10 VITALS — BP 102/68 | HR 79 | Temp 98.0°F | Resp 16 | Ht 66.0 in | Wt 139.6 lb

## 2022-01-10 VITALS — BP 105/64 | HR 52

## 2022-01-10 DIAGNOSIS — Z5111 Encounter for antineoplastic chemotherapy: Secondary | ICD-10-CM | POA: Insufficient documentation

## 2022-01-10 DIAGNOSIS — C884 Extranodal marginal zone B-cell lymphoma of mucosa-associated lymphoid tissue [MALT-lymphoma]: Secondary | ICD-10-CM

## 2022-01-10 DIAGNOSIS — Z5181 Encounter for therapeutic drug level monitoring: Secondary | ICD-10-CM

## 2022-01-10 DIAGNOSIS — Z79620 Long term (current) use of immunosuppressive biologic: Secondary | ICD-10-CM

## 2022-01-10 DIAGNOSIS — Z79899 Other long term (current) drug therapy: Secondary | ICD-10-CM | POA: Insufficient documentation

## 2022-01-10 DIAGNOSIS — C8510 Unspecified B-cell lymphoma, unspecified site: Secondary | ICD-10-CM | POA: Insufficient documentation

## 2022-01-10 LAB — COMPREHENSIVE METABOLIC PANEL
ALT: 20 U/L (ref 0–44)
AST: 22 U/L (ref 15–41)
Albumin: 3.9 g/dL (ref 3.5–5.0)
Alkaline Phosphatase: 67 U/L (ref 38–126)
Anion gap: 6 (ref 5–15)
BUN: 16 mg/dL (ref 6–20)
CO2: 22 mmol/L (ref 22–32)
Calcium: 8.8 mg/dL — ABNORMAL LOW (ref 8.9–10.3)
Chloride: 106 mmol/L (ref 98–111)
Creatinine, Ser: 1.02 mg/dL (ref 0.61–1.24)
GFR, Estimated: >60 mL/min (ref >60)
Glucose, Bld: 120 mg/dL — ABNORMAL HIGH (ref 70–99)
Potassium: 3.6 mmol/L (ref 3.5–5.1)
Sodium: 134 mmol/L — ABNORMAL LOW (ref 135–145)
Total Bilirubin: 0.9 mg/dL (ref 0.3–1.2)
Total Protein: 8.3 g/dL — ABNORMAL HIGH (ref 6.5–8.1)

## 2022-01-10 LAB — CBC WITH DIFFERENTIAL/PLATELET
Abs Immature Granulocytes: 0 10*3/uL (ref 0.00–0.07)
Basophils Absolute: 0 10*3/uL (ref 0.0–0.1)
Basophils Relative: 1 %
Eosinophils Absolute: 0.4 10*3/uL (ref 0.0–0.5)
Eosinophils Relative: 10 %
HCT: 36 % — ABNORMAL LOW (ref 39.0–52.0)
Hemoglobin: 12.9 g/dL — ABNORMAL LOW (ref 13.0–17.0)
Immature Granulocytes: 0 %
Lymphocytes Relative: 30 %
Lymphs Abs: 1.1 10*3/uL (ref 0.7–4.0)
MCH: 31.3 pg (ref 26.0–34.0)
MCHC: 35.8 g/dL (ref 30.0–36.0)
MCV: 87.4 fL (ref 80.0–100.0)
Monocytes Absolute: 0.4 10*3/uL (ref 0.1–1.0)
Monocytes Relative: 9 %
Neutro Abs: 1.9 10*3/uL (ref 1.7–7.7)
Neutrophils Relative %: 50 %
Platelets: 245 10*3/uL (ref 150–400)
RBC: 4.12 MIL/uL — ABNORMAL LOW (ref 4.22–5.81)
RDW: 11.9 % (ref 11.5–15.5)
WBC: 3.8 10*3/uL — ABNORMAL LOW (ref 4.0–10.5)
nRBC: 0 % (ref 0.0–0.2)

## 2022-01-10 MED ORDER — SODIUM CHLORIDE 0.9 % IV SOLN
Freq: Once | INTRAVENOUS | Status: AC
  Filled 2022-01-10: qty 250

## 2022-01-10 MED ORDER — SODIUM CHLORIDE 0.9 % IV SOLN
375.0000 mg/m2 | Freq: Once | INTRAVENOUS | Status: AC
  Administered 2022-01-10: 600 mg via INTRAVENOUS
  Filled 2022-01-10: qty 50

## 2022-01-10 MED ORDER — ACETAMINOPHEN 325 MG PO TABS
650.0000 mg | ORAL_TABLET | Freq: Once | ORAL | Status: AC
  Administered 2022-01-10: 650 mg via ORAL
  Filled 2022-01-10: qty 2

## 2022-01-10 MED ORDER — DIPHENHYDRAMINE HCL 25 MG PO CAPS
50.0000 mg | ORAL_CAPSULE | Freq: Once | ORAL | Status: AC
  Administered 2022-01-10: 50 mg via ORAL
  Filled 2022-01-10: qty 2

## 2022-01-10 NOTE — Patient Instructions (Signed)
[facility] AT [place]  Discharge Instructions: ?Thank you for choosing [facility] to provide your oncology and hematology care.  ?If you have a lab appointment with the Cancer Center, please go directly to the Cancer Center and check in at the registration area. ? ?Wear comfortable clothing and clothing appropriate for easy access to any Portacath or PICC line.  ? ?We strive to give you quality time with your provider. You may need to reschedule your appointment if you arrive late (15 or more minutes).  Arriving late affects you and other patients whose appointments are after yours.  Also, if you miss three or more appointments without notifying the office, you may be dismissed from the clinic at the provider?s discretion.    ?  ?For prescription refill requests, have your pharmacy contact our office and allow 72 hours for refills to be completed.   ? ?Today you received the following chemotherapy and/or immunotherapy agents Rituxan  ?  ?To help prevent nausea and vomiting after your treatment, we encourage you to take your nausea medication as directed. ? ?BELOW ARE SYMPTOMS THAT SHOULD BE REPORTED IMMEDIATELY: ?*FEVER GREATER THAN 100.4 F (38 ?C) OR HIGHER ?*CHILLS OR SWEATING ?*NAUSEA AND VOMITING THAT IS NOT CONTROLLED WITH YOUR NAUSEA MEDICATION ?*UNUSUAL SHORTNESS OF BREATH ?*UNUSUAL BRUISING OR BLEEDING ?*URINARY PROBLEMS (pain or burning when urinating, or frequent urination) ?*BOWEL PROBLEMS (unusual diarrhea, constipation, pain near the anus) ?TENDERNESS IN MOUTH AND THROAT WITH OR WITHOUT PRESENCE OF ULCERS (sore throat, sores in mouth, or a toothache) ?UNUSUAL RASH, SWELLING OR PAIN  ?UNUSUAL VAGINAL DISCHARGE OR ITCHING  ? ?Items with * indicate a potential emergency and should be followed up as soon as possible or go to the Emergency Department if any problems should occur. ? ?Please show the CHEMOTHERAPY ALERT CARD or IMMUNOTHERAPY ALERT CARD at check-in to the  Emergency Department and triage nurse. ? ?Should you have questions after your visit or need to cancel or reschedule your appointment, please contact [facility] AT [place]  [phone] and follow the prompts.  Office hours are 8:00 a.m. to 4:30 p.m. Monday - Friday. Please note that voicemails left after 4:00 p.m. may not be returned until the following business day.  We are closed weekends and major holidays. You have access to a nurse at all times for urgent questions. Please call the main number to the clinic [phone] and follow the prompts. ? ?For any non-urgent questions, you may also contact your provider using MyChart. We now offer e-Visits for anyone 18 and older to request care online for non-urgent symptoms. For details visit [email or web address]. ?  ?Also download the MyChart app! Go to the app store, search "MyChart", open the app, select [facility], and log in with your MyChart username and password. ? ?Due to Covid, a mask is required upon entering the hospital/clinic. If you do not have a mask, one will be given to you upon arrival. For doctor visits, patients may have 1 support person aged 18 or older with them. For treatment visits, patients cannot have anyone with them due to current Covid guidelines and our immunocompromised population.  ?

## 2022-01-10 NOTE — Progress Notes (Signed)
? ? ? ?Hematology/Oncology Consult note ?[place]  ?Telephone:([phone]) [phone] [phone] ? ?Patient Care Team: ?Pcp, No as PCP - General ?Sindy Guadeloupe, MD as Consulting Physician (Oncology) ?Clyde Canterbury, MD as Referring Physician (Otolaryngology)  ? ?Name of the patient: Charles Bowers  ?[id number]  ?1970-11-23  ? ?Date of visit: 01/10/22 ? ?Diagnosis-  Stage IIE extranodal marginal zone lymphoma ? ?Chief complaint/ Reason for visit-on treatment assessment prior to cycle 4 of weekly Rituxan chemotherapy ? ?Heme/Onc history: [name] and history obtained with the help ofSpanish interpreter.  He does not have any known medical problems. He was noted to have swelling involving his right cheek that has been ongoing for the last 2 to 3 months.  Patient is here with his son today who reports that he feels that patient has lost a lot of weight in the last 3 months.  Patient endorses that his appetite is poor.  Denies any significant pain. ?  ?Patient was seen by ENT and underwent CT soft tissue neck which showed an enhancing lesion in the soft palate concerning for primary malignancy.  Bulky lymphadenopathy in the right neck with large mass centered in the right parotid gland multiple enlarged right cervical chain lymph nodes measuring up to 2 cm in size.  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  Ki-67 10 to 20%.  Overall immunoreactivity supports a differential diagnosis of extranodal marginal zone lymphoma versus lymphoplasmacytic lymphoma.  NYD 88 testing on the biopsy specimen was negative. ?  ?Plan is for 4 weekly cycles of Rituxan followed by repeat PET ?  ? ?Interval history-patient is tolerating treatment well so far.  Neck swelling has resolved.  He does report  difficulty chewing and swallowing meat products.  States that he is edentulous and dentures along with history of xerostomia makes it difficult for him to chew and swallow food. ? ?ECOG PS- 1 ?Pain scale- 0 ? ? ?Review of systems- Review of Systems  ?Constitutional:  Negative for chills, fever, malaise/fatigue and weight loss.  ?HENT:  Negative for congestion, ear discharge and nosebleeds.   ?Eyes:  Negative for blurred vision.  ?Respiratory:  Negative for cough, hemoptysis, sputum production, shortness of breath and wheezing.   ?Cardiovascular:  Negative for chest pain, palpitations, orthopnea and claudication.  ?Gastrointestinal:  Negative for abdominal pain, blood in stool, constipation, diarrhea, heartburn, melena, nausea and vomiting.  ?Genitourinary:  Negative for dysuria, flank pain, frequency, hematuria and urgency.  ?Musculoskeletal:  Negative for back pain, joint pain and myalgias.  ?Skin:  Negative for rash.  ?Neurological:  Negative for dizziness, tingling, focal weakness, seizures, weakness and headaches.  ?Endo/Heme/Allergies:  Does not bruise/bleed easily.  ?Psychiatric/Behavioral:  Negative for depression and suicidal ideas. The patient does not have insomnia.    ? ? ? ?No Known Allergies ? ? ?Past Medical History:  ?Diagnosis Date  ? History of COVID-19 2020  ? Lymphoma ([place]) 10/30/2021  ? low grade b cell lymphoma  ? Mass of right parotid gland   ? ? ? ?History reviewed. No pertinent surgical history. ? ?Social History  ? ?Socioeconomic History  ? Marital status: Married  ?  Spouse name: Not on file  ? Number of children: Not on file  ? Years of education: Not on file  ? Highest education level:  Not on file  ?Occupational History  ? Not on file  ?Tobacco Use  ? Smoking status: Never  ?  Passive exposure: Never  ? Smokeless tobacco: Never  ?Vaping Use  ? Vaping Use: Never used  ?Substance and Sexual Activity  ? Alcohol use: Never  ? Drug use: Never  ? Sexual activity: Yes  ?Other Topics Concern  ?  Not on file  ?Social History Narrative  ? Not on file  ? ?Social Determinants of Health  ? ?Financial Resource Strain: Medium Risk  ? Difficulty of Paying Living Expenses: Somewhat hard  ?Food Insecurity: No Food Insecurity  ? Worried About Charity fundraiser in the Last Year: Never true  ? Ran Out of Food in the Last Year: Never true  ?Transportation Needs: No Transportation Needs  ? Lack of Transportation (Medical): No  ? Lack of Transportation (Non-Medical): No  ?Physical Activity: Inactive  ? Days of Exercise per Week: 0 days  ? Minutes of Exercise per Session: 0 min  ?Stress: Stress Concern Present  ? Feeling of Stress : To some extent  ?Social Connections: Moderately Isolated  ? Frequency of Communication with Friends and Family: Three times a week  ? Frequency of Social Gatherings with Friends and Family: Three times a week  ? Attends Religious Services: Never  ? Active Member of Clubs or Organizations: No  ? Attends Archivist Meetings: Never  ? Marital Status: Married  ?Intimate Partner Violence: Not At Risk  ? Fear of Current or Ex-Partner: No  ? Emotionally Abused: No  ? Physically Abused: No  ? Sexually Abused: No  ? ? ?No family history on file. ? ? ?Current Outpatient Medications:  ?  acetaminophen (TYLENOL) 500 MG tablet, Take 500 mg by mouth daily. (Patient not taking: Reported on 12/27/2021), Disp: , Rfl:  ? ?Physical exam:  ?Vitals:  ? 01/10/22 0833  ?BP: 102/68  ?Pulse: 79  ?Resp: 16  ?Temp: 98 ?F (36.7 ?C)  ?TempSrc: Oral  ?Weight: 139 lb 9.6 oz (63.3 kg)  ?Height: _0  (1.676 m)  ? ?Physical Exam ?Constitutional:   ?   General: He is not in acute distress. ?Cardiovascular:  ?   Rate and Rhythm: Normal rate and regular rhythm.  ?   Heart sounds: Normal heart sounds.  ?Pulmonary:  ?   Effort: Pulmonary effort is normal.  ?   Breath sounds: Normal breath sounds.  ?Abdominal:  ?   General: Bowel sounds are normal.  ?   Palpations: Abdomen is soft.  ?Lymphadenopathy:  ?   Comments: No  palpable neck mass or cervical adenopathy  ?Skin: ?   General: Skin is warm and dry.  ?Neurological:  ?   Mental Status: He is alert and oriented to person, place, and time.  ?  ? ? ?  Latest Ref Rng & Units 01/10/2022  ?  8:10 AM  ?CMP  ?Glucose 70 - 99 mg/dL 120    ?BUN 6 - 20 mg/dL 16    ?Creatinine 0.61 - 1.24 mg/dL 1.02    ?Sodium 135 - 145 mmol/L 134    ?Potassium 3.5 - 5.1 mmol/L 3.6    ?Chloride 98 - 111 mmol/L 106    ?CO2 22 - 32 mmol/L 22    ?Calcium 8.9 - 10.3 mg/dL 8.8    ?Total Protein 6.5 - 8.1 g/dL 8.3    ?Total Bilirubin 0.3 - 1.2 mg/dL 0.9    ?Alkaline Phos 38 - 126 U/L 67    ?  AST 15 - 41 U/L 22    ?ALT 0 - 44 U/L 20    ? ? ?  Latest Ref Rng & Units 01/10/2022  ?  8:10 AM  ?CBC  ?WBC 4.0 - 10.5 K/uL 3.8    ?Hemoglobin 13.0 - 17.0 g/dL 12.9    ?Hematocrit 39.0 - 52.0 % 36.0    ?Platelets 150 - 400 K/uL 245    ? ? ? ? ?Assessment and plan- Patient is a 51 y.o. male with newly diagnosed stage extranodal marginal zone lymphoma stage IIE.  He is here for on treatment assessment prior to cycle 4 of weekly Rituxan chemotherapy ? ?Counts okay to proceed with cycle 4 of weekly Rituxan chemotherapy today which will be his last cycle.  He is scheduled for PET CT scan next month and I will see him following that.White cell count is 3.8 today with an ANC of 1.9 which should recover now that he is getting done with Rituxan.  Clinically he has responded well to treatment.  Neck mass has resolved. ? ?Suspect difficulty swallowing secondary to difficulty with dentures and possible history of xerostomia.  He is unable to tell me clearly if he has difficulty with food getting stuck after he swallows.  PET CT scan did not show any evidence of hypermetabolism in the area of esophagus. ?  ?Visit Diagnosis ?1. Encounter for monitoring rituximab therapy   ?2. Extranodal marginal zone B-cell lymphoma (HCC)   ? ? ? ?Dr. Randa Evens, MD, MPH ?[facility] ?[phone] ?01/10/2022 ?8:42 AM ? ? ? ? ? ? ?     ? ? ? ? ? ?

## 2022-01-10 NOTE — Progress Notes (Signed)
Pt has trouble with swallowing with meat- he was told that he should eat little bites and chew very much before swallowing. Sometimes gets tingling/numbness of bil. feet ?

## 2022-02-11 ENCOUNTER — Ambulatory Visit (HOSPITAL_COMMUNITY): Admission: RE | Admit: 2022-02-11 | Payer: Self-pay | Source: Ambulatory Visit

## 2022-02-12 ENCOUNTER — Encounter (HOSPITAL_COMMUNITY)
Admission: RE | Admit: 2022-02-12 | Discharge: 2022-02-12 | Disposition: A | Discharge status: Home / Self Care | Payer: Self-pay | Source: Ambulatory Visit | Attending: Oncology | Admitting: Oncology

## 2022-02-12 DIAGNOSIS — C884 Extranodal marginal zone B-cell lymphoma of mucosa-associated lymphoid tissue [MALT-lymphoma]: Secondary | ICD-10-CM | POA: Insufficient documentation

## 2022-02-12 LAB — GLUCOSE, CAPILLARY: Glucose-Capillary: 114 mg/dL — ABNORMAL HIGH (ref 70–99)

## 2022-02-12 MED ORDER — FLUDEOXYGLUCOSE F - 18 (FDG) INJECTION
6.9000 | Freq: Once | INTRAVENOUS | Status: AC | PRN
  Administered 2022-02-12: 6.96 via INTRAVENOUS

## 2022-02-14 ENCOUNTER — Inpatient Hospital Stay (HOSPITAL_BASED_OUTPATIENT_CLINIC_OR_DEPARTMENT_OTHER): Payer: Self-pay | Admitting: Oncology

## 2022-02-14 ENCOUNTER — Inpatient Hospital Stay: Payer: Self-pay | Attending: Oncology

## 2022-02-14 ENCOUNTER — Encounter: Payer: Self-pay | Admitting: Oncology

## 2022-02-14 VITALS — BP 115/76 | HR 63 | Temp 99.0°F | Resp 16 | Ht 66.0 in | Wt 138.0 lb

## 2022-02-14 DIAGNOSIS — C884 Extranodal marginal zone B-cell lymphoma of mucosa-associated lymphoid tissue [MALT-lymphoma]: Secondary | ICD-10-CM

## 2022-02-14 DIAGNOSIS — Z8572 Personal history of non-Hodgkin lymphomas: Secondary | ICD-10-CM | POA: Insufficient documentation

## 2022-02-14 LAB — CBC WITH DIFFERENTIAL/PLATELET
Abs Immature Granulocytes: 0 10*3/uL (ref 0.00–0.07)
Basophils Absolute: 0 10*3/uL (ref 0.0–0.1)
Basophils Relative: 1 %
Eosinophils Absolute: 0.1 10*3/uL (ref 0.0–0.5)
Eosinophils Relative: 4 %
HCT: 37.4 % — ABNORMAL LOW (ref 39.0–52.0)
Hemoglobin: 13 g/dL (ref 13.0–17.0)
Immature Granulocytes: 0 %
Lymphocytes Relative: 29 %
Lymphs Abs: 1 10*3/uL (ref 0.7–4.0)
MCH: 31 pg (ref 26.0–34.0)
MCHC: 34.8 g/dL (ref 30.0–36.0)
MCV: 89 fL (ref 80.0–100.0)
Monocytes Absolute: 0.4 10*3/uL (ref 0.1–1.0)
Monocytes Relative: 13 %
Neutro Abs: 1.9 10*3/uL (ref 1.7–7.7)
Neutrophils Relative %: 53 %
Platelets: 255 10*3/uL (ref 150–400)
RBC: 4.2 MIL/uL — ABNORMAL LOW (ref 4.22–5.81)
RDW: 12.4 % (ref 11.5–15.5)
WBC: 3.4 10*3/uL — ABNORMAL LOW (ref 4.0–10.5)
nRBC: 0 % (ref 0.0–0.2)

## 2022-02-14 LAB — COMPREHENSIVE METABOLIC PANEL
ALT: 20 U/L (ref 0–44)
AST: 28 U/L (ref 15–41)
Albumin: 4 g/dL (ref 3.5–5.0)
Alkaline Phosphatase: 72 U/L (ref 38–126)
Anion gap: 9 (ref 5–15)
BUN: 14 mg/dL (ref 6–20)
CO2: 23 mmol/L (ref 22–32)
Calcium: 8.9 mg/dL (ref 8.9–10.3)
Chloride: 103 mmol/L (ref 98–111)
Creatinine, Ser: 0.95 mg/dL (ref 0.61–1.24)
GFR, Estimated: >60 mL/min (ref >60)
Glucose, Bld: 97 mg/dL (ref 70–99)
Potassium: 3.8 mmol/L (ref 3.5–5.1)
Sodium: 135 mmol/L (ref 135–145)
Total Bilirubin: 0.9 mg/dL (ref 0.3–1.2)
Total Protein: 8.3 g/dL — ABNORMAL HIGH (ref 6.5–8.1)

## 2022-02-14 NOTE — Progress Notes (Signed)
? ? ? ?Hematology/Oncology Consult note ?[place]  ?Telephone:(336) [phone] [phone] ? ?Patient Care Team: ?Pcp, No as PCP - General ?Sindy Guadeloupe, MD as Consulting Physician (Oncology) ?Clyde Canterbury, MD as Referring Physician (Otolaryngology)  ? ?Name of the patient: Charles Bowers  ?[id number]  ?December 09, 1970  ? ?Date of visit: 02/14/22 ? 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 ?Chief complaint/ Reason for visit-discuss PET CT scan results and further management ? ?Heme/Onc history: [name] and history obtained with the help ofSpanish interpreter.  He does not have any known medical problems. He was noted to have swelling involving his right cheek that has been ongoing for the last 2 to 3 months.  Patient is here with his son today who reports that he feels that patient has lost a lot of weight in the last 3 months.  Patient endorses that his appetite is poor.  Denies any significant pain. ?  ?Patient was seen by ENT and underwent CT soft tissue neck which showed an enhancing lesion in the soft palate concerning for primary malignancy.  Bulky lymphadenopathy in the right neck with large mass centered in the right parotid gland multiple enlarged right cervical chain lymph nodes measuring up to 2 cm in size.  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  Ki-67 10 to 20%.  Overall immunoreactivity supports a differential diagnosis of extranodal marginal zone lymphoma versus lymphoplasmacytic lymphoma.  NYD 88 testing on the biopsy specimen was negative. ?  ?Plan is for 4 weekly cycles of Rituxan followed by repeat PET ? ?Interval history-patient reports occasional burning sensation in the area of his right parotid but otherwise is doing well.  History  obtained with the help of Spanish interpreter ? ?ECOG PS- 0 ?Pain scale- 0 ? ? ?Review of systems- Review of Systems  ?Constitutional:  Negative for chills, fever, malaise/fatigue and weight loss.  ?HENT:  Negative for congestion, ear discharge and nosebleeds.   ?Eyes:  Negative for blurred vision.  ?Respiratory:  Negative for cough, hemoptysis, sputum production, shortness of breath and wheezing.   ?Cardiovascular:  Negative for chest pain, palpitations, orthopnea and claudication.  ?Gastrointestinal:  Negative for abdominal pain, blood in stool, constipation, diarrhea, heartburn, melena, nausea and vomiting.  ?Genitourinary:  Negative for dysuria, flank pain, frequency, hematuria and urgency.  ?Musculoskeletal:  Negative for back pain, joint pain and myalgias.  ?Skin:  Negative for rash.  ?Neurological:  Negative for dizziness, tingling, focal weakness, seizures, weakness and headaches.  ?Endo/Heme/Allergies:  Does not bruise/bleed easily.  ?Psychiatric/Behavioral:  Negative for depression and suicidal ideas. The patient does not have insomnia.    ? ? ? ?No Known Allergies ? ? ?Past Medical History:  ?Diagnosis Date  ? History of COVID-19 2020  ? Lymphoma ([place]) 10/30/2021  ? low grade b cell lymphoma  ? Mass of right parotid gland   ? ? ? ?History reviewed. No pertinent surgical history. ? ?Social History  ? ?Socioeconomic History  ? Marital status: Married  ?  Spouse name: Not on file  ? Number of children: Not on file  ? Years of education: Not on file  ? Highest education level: Not on file  ?Occupational History  ? Not on file  ?Tobacco Use  ? Smoking status: Never  ?  Passive exposure: Never  ?  Smokeless tobacco: Never  ?Vaping Use  ? Vaping Use: Never used  ?Substance and Sexual Activity  ? Alcohol use: Never  ? Drug use: Never  ? Sexual activity: Yes  ?Other Topics Concern  ? Not on file  ?Social History Narrative  ? Not on file  ? ?Social Determinants of Health  ? ?Financial Resource Strain: Medium Risk  ?  Difficulty of Paying Living Expenses: Somewhat hard  ?Food Insecurity: No Food Insecurity  ? Worried About Charity fundraiser in the Last Year: Never true  ? Ran Out of Food in the Last Year: Never true  ?Transportation Needs: No Transportation Needs  ? Lack of Transportation (Medical): No  ? Lack of Transportation (Non-Medical): No  ?Physical Activity: Inactive  ? Days of Exercise per Week: 0 days  ? Minutes of Exercise per Session: 0 min  ?Stress: Stress Concern Present  ? Feeling of Stress : To some extent  ?Social Connections: Moderately Isolated  ? Frequency of Communication with Friends and Family: Three times a week  ? Frequency of Social Gatherings with Friends and Family: Three times a week  ? Attends Religious Services: Never  ? Active Member of Clubs or Organizations: No  ? Attends Archivist Meetings: Never  ? Marital Status: Married  ?Intimate Partner Violence: Not At Risk  ? Fear of Current or Ex-Partner: No  ? Emotionally Abused: No  ? Physically Abused: No  ? Sexually Abused: No  ? ? ?History reviewed. No pertinent family history. ? ? ?Current Outpatient Medications:  ?  acetaminophen (TYLENOL) 500 MG tablet, Take 500 mg by mouth daily. (Patient not taking: Reported on 12/27/2021), Disp: , Rfl:  ? ?Physical exam:  ?Vitals:  ? 02/14/22 1408  ?BP: 115/76  ?Pulse: 63  ?Resp: 16  ?Temp: 99 ?F (37.2 ?C)  ?TempSrc: Tympanic  ?SpO2: 100%  ?Weight: 138 lb (62.6 kg)  ?Height: _0  (1.676 m)  ? ?Physical Exam ?Cardiovascular:  ?   Rate and Rhythm: Normal rate and regular rhythm.  ?   Heart sounds: Normal heart sounds.  ?Pulmonary:  ?   Effort: Pulmonary effort is normal.  ?   Breath sounds: Normal breath sounds.  ?Abdominal:  ?   General: Bowel sounds are normal.  ?   Palpations: Abdomen is soft.  ?Skin: ?   General: Skin is warm and dry.  ?Neurological:  ?   Mental Status: He is alert and oriented to person, place, and time.  ?  ? ? ?  Latest Ref Rng & Units 02/14/2022  ?  1:51 PM  ?CMP  ?Glucose 70  - 99 mg/dL 97    ?BUN 6 - 20 mg/dL 14    ?Creatinine 0.61 - 1.24 mg/dL 0.95    ?Sodium 135 - 145 mmol/L 135    ?Potassium 3.5 - 5.1 mmol/L 3.8    ?Chloride 98 - 111 mmol/L 103    ?CO2 22 - 32 mmol/L 23    ?Calcium 8.9 - 10.3 mg/dL 8.9    ?Total Protein 6.5 - 8.1 g/dL 8.3    ?Total Bilirubin 0.3 - 1.2 mg/dL 0.9    ?Alkaline Phos 38 - 126 U/L 72    ?AST 15 - 41 U/L 28    ?ALT 0 - 44 U/L 20    ? ? ?  Latest Ref Rng & Units 02/14/2022  ?  1:51 PM  ?CBC  ?WBC 4.0 - 10.5 K/uL 3.4    ?Hemoglobin 13.0 - 17.0 g/dL 13.0    ?  Hematocrit 39.0 - 52.0 % 37.4    ?Platelets 150 - 400 K/uL 255    ? ? ?No images are attached to the encounter. ? ?NM PET Image Restag (PS) Skull Base To Thigh ? ?Result Date: 02/13/2022 ?CLINICAL DATA:  Subsequent treatment strategy for lymphoma. EXAM: NUCLEAR MEDICINE PET SKULL BASE TO THIGH TECHNIQUE: 6.96 mCi F-18 FDG was injected intravenously. Full-ring PET imaging was performed from the skull base to thigh after the radiotracer. CT data was obtained and used for attenuation correction and anatomic localization. Fasting blood glucose: 114 mg/dl COMPARISON:  Nuclear medicine PET-CT December 06, 2021 FINDINGS: Mediastinal blood pool activity: SUV max 2.16 Liver activity: SUV max 2.83 NECK: Resolution of the hypermetabolic activity within the right parotid mass which is significantly decreased in size and difficult to accurately characterize on non contrasted CT possibly measuring 6 mm on image 15/4 without abnormal FDG avidity greater than that adjacent parotid gland. Previously measuring 2.9 cm with a max SUV of 4.1. Decreased size and FDG avidity of the previously indexed and non index hypermetabolic cervical lymph nodes. Index lymph nodes are as follows: -high right level 2 lymph node now measures 6 mm in short axis on image 24/4 with a max SUV of 1.64 previously measuring 12 mm in short axis with a max SUV of 4.3. -left level 2 lymph node measures 7 mm in short axis on image 25/4 with a max SUV of 1.86  previously measuring 9 mm with a max SUV of 2.7. Soft tissue nodule anterior to the right parotid gland now measures 10 x 6 mm on image 17/4 with a max SUV of 1.5 previously measuring 17 x 9 mm with a ma

## 2022-04-28 ENCOUNTER — Other Ambulatory Visit: Payer: Self-pay

## 2022-06-09 ENCOUNTER — Other Ambulatory Visit: Payer: Self-pay | Admitting: *Deleted

## 2022-06-09 DIAGNOSIS — C884 Extranodal marginal zone B-cell lymphoma of mucosa-associated lymphoid tissue [MALT-lymphoma]: Secondary | ICD-10-CM

## 2022-06-10 ENCOUNTER — Inpatient Hospital Stay: Payer: Self-pay | Attending: Oncology

## 2022-06-10 ENCOUNTER — Inpatient Hospital Stay (HOSPITAL_BASED_OUTPATIENT_CLINIC_OR_DEPARTMENT_OTHER): Payer: Self-pay | Admitting: Oncology

## 2022-06-10 VITALS — BP 114/82 | HR 67 | Temp 98.4°F | Resp 16 | Ht 66.0 in | Wt 144.8 lb

## 2022-06-10 DIAGNOSIS — C884 Extranodal marginal zone B-cell lymphoma of mucosa-associated lymphoid tissue [MALT-lymphoma]: Secondary | ICD-10-CM

## 2022-06-10 DIAGNOSIS — Z9221 Personal history of antineoplastic chemotherapy: Secondary | ICD-10-CM | POA: Insufficient documentation

## 2022-06-10 DIAGNOSIS — Z8572 Personal history of non-Hodgkin lymphomas: Secondary | ICD-10-CM

## 2022-06-10 DIAGNOSIS — Z08 Encounter for follow-up examination after completed treatment for malignant neoplasm: Secondary | ICD-10-CM

## 2022-06-10 DIAGNOSIS — C8309 Small cell B-cell lymphoma, extranodal and solid organ sites: Secondary | ICD-10-CM | POA: Insufficient documentation

## 2022-06-10 LAB — CBC WITH DIFFERENTIAL/PLATELET
Abs Immature Granulocytes: 0.01 10*3/uL (ref 0.00–0.07)
Basophils Absolute: 0 10*3/uL (ref 0.0–0.1)
Basophils Relative: 1 %
Eosinophils Absolute: 0.3 10*3/uL (ref 0.0–0.5)
Eosinophils Relative: 8 %
HCT: 38.7 % — ABNORMAL LOW (ref 39.0–52.0)
Hemoglobin: 14 g/dL (ref 13.0–17.0)
Immature Granulocytes: 0 %
Lymphocytes Relative: 25 %
Lymphs Abs: 1 10*3/uL (ref 0.7–4.0)
MCH: 32.1 pg (ref 26.0–34.0)
MCHC: 36.2 g/dL — ABNORMAL HIGH (ref 30.0–36.0)
MCV: 88.8 fL (ref 80.0–100.0)
Monocytes Absolute: 0.4 10*3/uL (ref 0.1–1.0)
Monocytes Relative: 10 %
Neutro Abs: 2.4 10*3/uL (ref 1.7–7.7)
Neutrophils Relative %: 56 %
Platelets: 243 10*3/uL (ref 150–400)
RBC: 4.36 MIL/uL (ref 4.22–5.81)
RDW: 12.1 % (ref 11.5–15.5)
WBC: 4.1 10*3/uL (ref 4.0–10.5)
nRBC: 0 % (ref 0.0–0.2)

## 2022-06-10 LAB — COMPREHENSIVE METABOLIC PANEL
ALT: 22 U/L (ref 0–44)
AST: 24 U/L (ref 15–41)
Albumin: 4.4 g/dL (ref 3.5–5.0)
Alkaline Phosphatase: 83 U/L (ref 38–126)
Anion gap: 10 (ref 5–15)
BUN: 12 mg/dL (ref 6–20)
CO2: 25 mmol/L (ref 22–32)
Calcium: 9.1 mg/dL (ref 8.9–10.3)
Chloride: 101 mmol/L (ref 98–111)
Creatinine, Ser: 1.08 mg/dL (ref 0.61–1.24)
GFR, Estimated: >60 mL/min (ref >60)
Glucose, Bld: 99 mg/dL (ref 70–99)
Potassium: 4 mmol/L (ref 3.5–5.1)
Sodium: 136 mmol/L (ref 135–145)
Total Bilirubin: 0.7 mg/dL (ref 0.3–1.2)
Total Protein: 8.6 g/dL — ABNORMAL HIGH (ref 6.5–8.1)

## 2022-06-10 LAB — LACTATE DEHYDROGENASE: LDH: 134 U/L (ref 98–192)

## 2022-06-15 NOTE — Progress Notes (Signed)
Hematology/Oncology Consult note [facility]  [phone] [phone]  Patient Care Team: Pcp, No as PCP - General Sindy Guadeloupe, MD as Consulting Physician (Oncology) Clyde Canterbury, MD as Referring Physician (Otolaryngology)   Name of the patient: Charles Bowers  [id number]  02/12/1971   Date of visit: 06/15/22  Diagnosis- Stage IIE extranodal marginal zone lymphoma    Chief complaint/ Reason for visit- routine f/u of lymphoma  Heme/Onc history: [name] and history obtained with the help ofSpanish interpreter.  He does not have any known medical problems. He was noted to have swelling involving his right cheek that has been ongoing for the last 2 to 3 months.  Patient is here with his son today who reports that he feels that patient has lost a lot of weight in the last 3 months.  Patient endorses that his appetite is poor.  Denies any significant pain.   Patient was seen by ENT and underwent CT soft tissue neck which showed an enhancing lesion in the soft palate concerning for primary malignancy.  Bulky lymphadenopathy in the right neck with large mass centered in the right parotid gland multiple enlarged right cervical chain lymph nodes measuring up to 2 cm in size.  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  Ki-67 10 to 20%.  Overall immunoreactivity supports a differential diagnosis of extranodal marginal zone lymphoma versus lymphoplasmacytic lymphoma.  NYD 88 testing on the biopsy specimen was negative.   Patient had 4 weekly rituxan cycles with excellent response  Interval history-Patient is doing well and denies any specific complaints at this time.  Appetite and weight is normal.  Denies any drenching night sweats.   Denies any difficulty swallowing.  ECOG PS- 1 Pain scale- 0 Opioid associated constipation- no  Review of systems- Review of Systems  Constitutional:  Negative for chills, fever, malaise/fatigue and weight loss.  HENT:  Negative for congestion, ear discharge and nosebleeds.   Eyes:  Negative for blurred vision.  Respiratory:  Negative for cough, hemoptysis, sputum production, shortness of breath and wheezing.   Cardiovascular:  Negative for chest pain, palpitations, orthopnea and claudication.  Gastrointestinal:  Negative for abdominal pain, blood in stool, constipation, diarrhea, heartburn, melena, nausea and vomiting.  Genitourinary:  Negative for dysuria, flank pain, frequency, hematuria and urgency.  Musculoskeletal:  Negative for back pain, joint pain and myalgias.  Skin:  Negative for rash.  Neurological:  Negative for dizziness, tingling, focal weakness, seizures, weakness and headaches.  Endo/Heme/Allergies:  Does not bruise/bleed easily.  Psychiatric/Behavioral:  Negative for depression and suicidal ideas. The patient does not have insomnia.       No Known Allergies   Past Medical History:  Diagnosis Date   History of COVID-19 2020   Lymphoma ([place]) 10/30/2021   low grade b cell lymphoma   Mass of right parotid gland      No past surgical history on file.  Social History   Socioeconomic History   Marital status: Married    Spouse name: Not on file   Number of children: Not on file   Years of education: Not on file   Highest education level: Not on file  Occupational History   Not on file  Tobacco Use   Smoking status: Never    Passive  exposure: Never   Smokeless tobacco: Never  Vaping Use   Vaping Use: Never used  Substance and Sexual Activity   Alcohol use: Never   Drug use: Never   Sexual activity: Yes  Other Topics Concern   Not on file  Social History Narrative   Not on file   Social Determinants of Health   Financial Resource Strain: Medium  Risk (12/24/2021)   Overall Financial Resource Strain (CARDIA)    Difficulty of Paying Living Expenses: Somewhat hard  Food Insecurity: No Food Insecurity (12/24/2021)   Hunger Vital Sign    Worried About Running Out of Food in the Last Year: Never true    Ran Out of Food in the Last Year: Never true  Transportation Needs: No Transportation Needs (12/24/2021)   PRAPARE - Hydrologist (Medical): No    Lack of Transportation (Non-Medical): No  Physical Activity: Inactive (12/24/2021)   Exercise Vital Sign    Days of Exercise per Week: 0 days    Minutes of Exercise per Session: 0 min  Stress: Stress Concern Present (12/24/2021)   [place]    Feeling of Stress : To some extent  Social Connections: Moderately Isolated (12/24/2021)   Social Connection and Isolation Panel [NHANES]    Frequency of Communication with Friends and Family: Three times a week    Frequency of Social Gatherings with Friends and Family: Three times a week    Attends Religious Services: Never    Active Member of Clubs or Organizations: No    Attends Archivist Meetings: Never    Marital Status: Married  Human resources officer Violence: Not At Risk (12/24/2021)   Humiliation, Afraid, Rape, and Kick questionnaire    Fear of Current or Ex-Partner: No    Emotionally Abused: No    Physically Abused: No    Sexually Abused: No    No family history on file.   Current Outpatient Medications:    acetaminophen (TYLENOL) 500 MG tablet, Take 500 mg by mouth daily. (Patient not taking: Reported on 12/27/2021), Disp: , Rfl:   Physical exam:  Vitals:   06/10/22 1501  BP: 114/82  Pulse: 67  Resp: 16  Temp: 98.4 F (36.9 C)  TempSrc: Oral  Weight: 144 lb 12.8 oz (65.7 kg)  Height: 5' 6"  (1.676 m)   Physical Exam Constitutional:      General: He is not in acute distress. Cardiovascular:     Rate and Rhythm: Normal rate and  regular rhythm.     Heart sounds: Normal heart sounds.  Pulmonary:     Effort: Pulmonary effort is normal.     Breath sounds: Normal breath sounds.  Abdominal:     General: Bowel sounds are normal.     Palpations: Abdomen is soft.  Lymphadenopathy:     Comments: No palpable cervical, supraclavicular, axillary or inguinal adenopathy    Skin:    General: Skin is warm and dry.  Neurological:     Mental Status: He is alert and oriented to person, place, and time.         Latest Ref Rng & Units 06/10/2022    1:56 PM  CMP  Glucose 70 - 99 mg/dL 99   BUN 6 - 20 mg/dL 12   Creatinine 0.61 - 1.24 mg/dL 1.08   Sodium 135 - 145 mmol/L 136   Potassium 3.5 - 5.1 mmol/L 4.0   Chloride 98 - 111 mmol/L 101  CO2 22 - 32 mmol/L 25   Calcium 8.9 - 10.3 mg/dL 9.1   Total Protein 6.5 - 8.1 g/dL 8.6   Total Bilirubin 0.3 - 1.2 mg/dL 0.7   Alkaline Phos 38 - 126 U/L 83   AST 15 - 41 U/L 24   ALT 0 - 44 U/L 22       Latest Ref Rng & Units 06/10/2022    1:56 PM  CBC  WBC 4.0 - 10.5 K/uL 4.1   Hemoglobin 13.0 - 17.0 g/dL 14.0   Hematocrit 39.0 - 52.0 % 38.7   Platelets 150 - 400 K/uL 243     Assessment and plan- Patient is a 51 y.o. male  with history of stage IIE extranodal marginal B-cell lymphoma s/p 4 weekly cycles of Rituxan chemotherapy.  This is a routine follow-up visit  Clinically patient is doing well with no concerning signs of recurrence based on today's exam.  Patient does not require another routine surveillance imaging at this time.  I will see him back in 4 months with CBC with differential CMP and LDH   Visit Diagnosis 1. Encounter for follow-up surveillance of lymphoma      Dr. Randa Evens, MD, MPH [facility] [phone] 06/15/2022 5:43 PM

## 2022-10-13 ENCOUNTER — Inpatient Hospital Stay: Payer: Self-pay | Attending: Oncology | Admitting: Oncology

## 2022-10-13 ENCOUNTER — Inpatient Hospital Stay: Payer: Self-pay

## 2022-10-13 ENCOUNTER — Encounter: Payer: Self-pay | Admitting: Oncology

## 2022-10-13 VITALS — BP 118/70 | HR 66 | Temp 96.7°F | Resp 16 | Wt 146.0 lb

## 2022-10-13 DIAGNOSIS — Z08 Encounter for follow-up examination after completed treatment for malignant neoplasm: Secondary | ICD-10-CM

## 2022-10-13 DIAGNOSIS — C884 Extranodal marginal zone B-cell lymphoma of mucosa-associated lymphoid tissue [MALT-lymphoma]: Secondary | ICD-10-CM

## 2022-10-13 DIAGNOSIS — C8309 Small cell B-cell lymphoma, extranodal and solid organ sites: Secondary | ICD-10-CM | POA: Insufficient documentation

## 2022-10-13 LAB — COMPREHENSIVE METABOLIC PANEL
ALT: 32 U/L (ref 0–44)
AST: 29 U/L (ref 15–41)
Albumin: 4 g/dL (ref 3.5–5.0)
Alkaline Phosphatase: 83 U/L (ref 38–126)
Anion gap: 6 (ref 5–15)
BUN: 15 mg/dL (ref 6–20)
CO2: 25 mmol/L (ref 22–32)
Calcium: 8.7 mg/dL — ABNORMAL LOW (ref 8.9–10.3)
Chloride: 103 mmol/L (ref 98–111)
Creatinine, Ser: 0.85 mg/dL (ref 0.61–1.24)
GFR, Estimated: >60 mL/min (ref >60)
Glucose, Bld: 108 mg/dL — ABNORMAL HIGH (ref 70–99)
Potassium: 3.6 mmol/L (ref 3.5–5.1)
Sodium: 134 mmol/L — ABNORMAL LOW (ref 135–145)
Total Bilirubin: 0.7 mg/dL (ref 0.3–1.2)
Total Protein: 7.7 g/dL (ref 6.5–8.1)

## 2022-10-13 LAB — CBC WITH DIFFERENTIAL/PLATELET
Abs Immature Granulocytes: 0.01 10*3/uL (ref 0.00–0.07)
Basophils Absolute: 0 10*3/uL (ref 0.0–0.1)
Basophils Relative: 0 %
Eosinophils Absolute: 0.2 10*3/uL (ref 0.0–0.5)
Eosinophils Relative: 4 %
HCT: 36.3 % — ABNORMAL LOW (ref 39.0–52.0)
Hemoglobin: 12.8 g/dL — ABNORMAL LOW (ref 13.0–17.0)
Immature Granulocytes: 0 %
Lymphocytes Relative: 22 %
Lymphs Abs: 1.2 10*3/uL (ref 0.7–4.0)
MCH: 31.4 pg (ref 26.0–34.0)
MCHC: 35.3 g/dL (ref 30.0–36.0)
MCV: 89.2 fL (ref 80.0–100.0)
Monocytes Absolute: 0.5 10*3/uL (ref 0.1–1.0)
Monocytes Relative: 8 %
Neutro Abs: 3.7 10*3/uL (ref 1.7–7.7)
Neutrophils Relative %: 66 %
Platelets: 212 10*3/uL (ref 150–400)
RBC: 4.07 MIL/uL — ABNORMAL LOW (ref 4.22–5.81)
RDW: 12.3 % (ref 11.5–15.5)
WBC: 5.7 10*3/uL (ref 4.0–10.5)
nRBC: 0 % (ref 0.0–0.2)

## 2022-10-13 LAB — LACTATE DEHYDROGENASE: LDH: 118 U/L (ref 98–192)

## 2022-10-13 NOTE — Progress Notes (Signed)
Patient here for oncology follow-up appointment, expresses concerns of aching hands and tingling in legs

## 2022-10-25 NOTE — Progress Notes (Signed)
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  He does not have any known medical problems. He was noted to have swelling involving his right cheek that has been ongoing for the last 2 to 3 months.  Patient is here with his son today who reports that he feels that patient has lost a lot of weight in the last 3 months.  Patient endorses that his appetite is poor.  Denies any significant pain.   Patient was seen by ENT and underwent CT soft tissue neck which showed an enhancing lesion in the soft palate concerning for primary malignancy.  Bulky lymphadenopathy in the right neck with large mass centered in the right parotid gland multiple enlarged right cervical chain lymph nodes measuring up to 2 cm in size.  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  Ki-67 10 to 20%.  Overall immunoreactivity supports a differential diagnosis of extranodal marginal zone lymphoma versus lymphoplasmacytic lymphoma.  NYD 88 testing on the biopsy specimen was negative.   Patient had 4 weekly rituxan cycles with excellent response  Interval history-history obtained with the help of Spanish interpreter.  Overall patient is doing well.  Appetite and weight have remained stable.  Denies any  significant fatigue or unintentional weight loss.Has occasional tingling numbness in his feet.  ECOG PS- 0 Pain scale- 0   Review of systems- Review of Systems  Constitutional:  Negative for chills, fever, malaise/fatigue and weight loss.  HENT:  Negative for congestion, ear discharge and nosebleeds.   Eyes:  Negative for blurred vision.  Respiratory:  Negative for cough, hemoptysis, sputum production, shortness of breath and wheezing.   Cardiovascular:  Negative for chest pain, palpitations, orthopnea and claudication.  Gastrointestinal:  Negative for abdominal pain, blood in stool, constipation, diarrhea, heartburn, melena, nausea and vomiting.  Genitourinary:  Negative for dysuria, flank pain, frequency, hematuria and urgency.  Musculoskeletal:  Negative for back pain, joint pain and myalgias.  Skin:  Negative for rash.  Neurological:  Negative for dizziness, tingling, focal weakness, seizures, weakness and headaches.  Endo/Heme/Allergies:  Does not bruise/bleed easily.  Psychiatric/Behavioral:  Negative for depression and suicidal ideas. The patient does not have insomnia.       No Known Allergies   Past Medical History:  Diagnosis Date   History of COVID-19 2020   Lymphoma ([place]) 10/30/2021   low grade b cell lymphoma   Mass of right parotid gland      History reviewed. No pertinent surgical history.  Social History   Socioeconomic History   Marital status: Married    Spouse name: Not on file   Number of children: Not on file   Years of education: Not on file   Highest education level: Not on file  Occupational History   Not on file  Tobacco Use   Smoking status: Never  Passive exposure: Never   Smokeless tobacco: Never  Vaping Use   Vaping Use: Never used  Substance and Sexual Activity   Alcohol use: Never   Drug use: Never   Sexual activity: Yes  Other Topics Concern   Not on file  Social History Narrative   Not on file   Social Determinants of  Health   Financial Resource Strain: Medium Risk (12/24/2021)   Overall Financial Resource Strain (CARDIA)    Difficulty of Paying Living Expenses: Somewhat hard  Food Insecurity: No Food Insecurity (12/24/2021)   Hunger Vital Sign    Worried About Running Out of Food in the Last Year: Never true    Ran Out of Food in the Last Year: Never true  Transportation Needs: No Transportation Needs (12/24/2021)   PRAPARE - Hydrologist (Medical): No    Lack of Transportation (Non-Medical): No  Physical Activity: Inactive (12/24/2021)   Exercise Vital Sign    Days of Exercise per Week: 0 days    Minutes of Exercise per Session: 0 min  Stress: Stress Concern Present (12/24/2021)   [place]    Feeling of Stress : To some extent  Social Connections: Moderately Isolated (12/24/2021)   Social Connection and Isolation Panel [NHANES]    Frequency of Communication with Friends and Family: Three times a week    Frequency of Social Gatherings with Friends and Family: Three times a week    Attends Religious Services: Never    Active Member of Clubs or Organizations: No    Attends Archivist Meetings: Never    Marital Status: Married  Human resources officer Violence: Not At Risk (12/24/2021)   Humiliation, Afraid, Rape, and Kick questionnaire    Fear of Current or Ex-Partner: No    Emotionally Abused: No    Physically Abused: No    Sexually Abused: No    History reviewed. No pertinent family history.   Current Outpatient Medications:    acetaminophen (TYLENOL) 500 MG tablet, Take 500 mg by mouth daily., Disp: , Rfl:   Physical exam:  Vitals:   10/13/22 1453  BP: 118/70  Pulse: 66  Resp: 16  Temp: (!) 96.7 F (35.9 C)  SpO2: 97%  Weight: 146 lb (66.2 kg)   Physical Exam Cardiovascular:     Rate and Rhythm: Normal rate and regular rhythm.     Heart sounds: Normal heart sounds.  Pulmonary:      Effort: Pulmonary effort is normal.     Breath sounds: Normal breath sounds.  Abdominal:     General: Bowel sounds are normal.     Palpations: Abdomen is soft.  Lymphadenopathy:     Comments: No palpable cervical, supraclavicular, axillary or inguinal adenopathy    Skin:    General: Skin is warm and dry.  Neurological:     Mental Status: He is alert and oriented to person, place, and time.         Latest Ref Rng & Units 10/13/2022    2:24 PM  CMP  Glucose 70 - 99 mg/dL 108   BUN 6 - 20 mg/dL 15   Creatinine 0.61 - 1.24 mg/dL 0.85   Sodium 135 - 145 mmol/L 134   Potassium 3.5 - 5.1 mmol/L 3.6   Chloride 98 - 111 mmol/L 103   CO2 22 - 32 mmol/L 25   Calcium 8.9 - 10.3 mg/dL 8.7   Total Protein 6.5 - 8.1 g/dL  7.7   Total Bilirubin 0.3 - 1.2 mg/dL 0.7   Alkaline Phos 38 - 126 U/L 83   AST 15 - 41 U/L 29   ALT 0 - 44 U/L 32       Latest Ref Rng & Units 10/13/2022    2:24 PM  CBC  WBC 4.0 - 10.5 K/uL 5.7   Hemoglobin 13.0 - 17.0 g/dL 12.8   Hematocrit 39.0 - 52.0 % 36.3   Platelets 150 - 400 K/uL 212      Assessment and plan- Patient is a 52 y.o. male  with history of stage IIE extranodal marginal B-cell lymphoma s/p 4 weekly cycles of Rituxan chemotherapy.  This is a routine follow-up visit  Patient does not have any B symptoms presently.  No palpable adenopathy or hepatosplenomegaly.  No B symptoms.  No concerning signs and symptoms of recurrence based on today's exam.  I will see him back in 6 months with labs CBC with differential CMP and LDH   Visit Diagnosis 1. Extranodal marginal zone B-cell lymphoma (HCC)      Dr. Randa Evens, MD, MPH [facility] at [facility] [phone] 10/25/2022 11:55 AM

## 2023-02-28 IMAGING — CR DG CHEST 2V
1 series · 2 of 2 positions shown · non-contrast
Comparison: Chest x-ray dated April 27, 2014.

CLINICAL DATA: Unexplained weight loss.

EXAM:
CHEST - 2 VIEW

[Series 1: dg chest 2 view · 0.14mm/px · 2 of 2 slices shown]
[im 1/2]
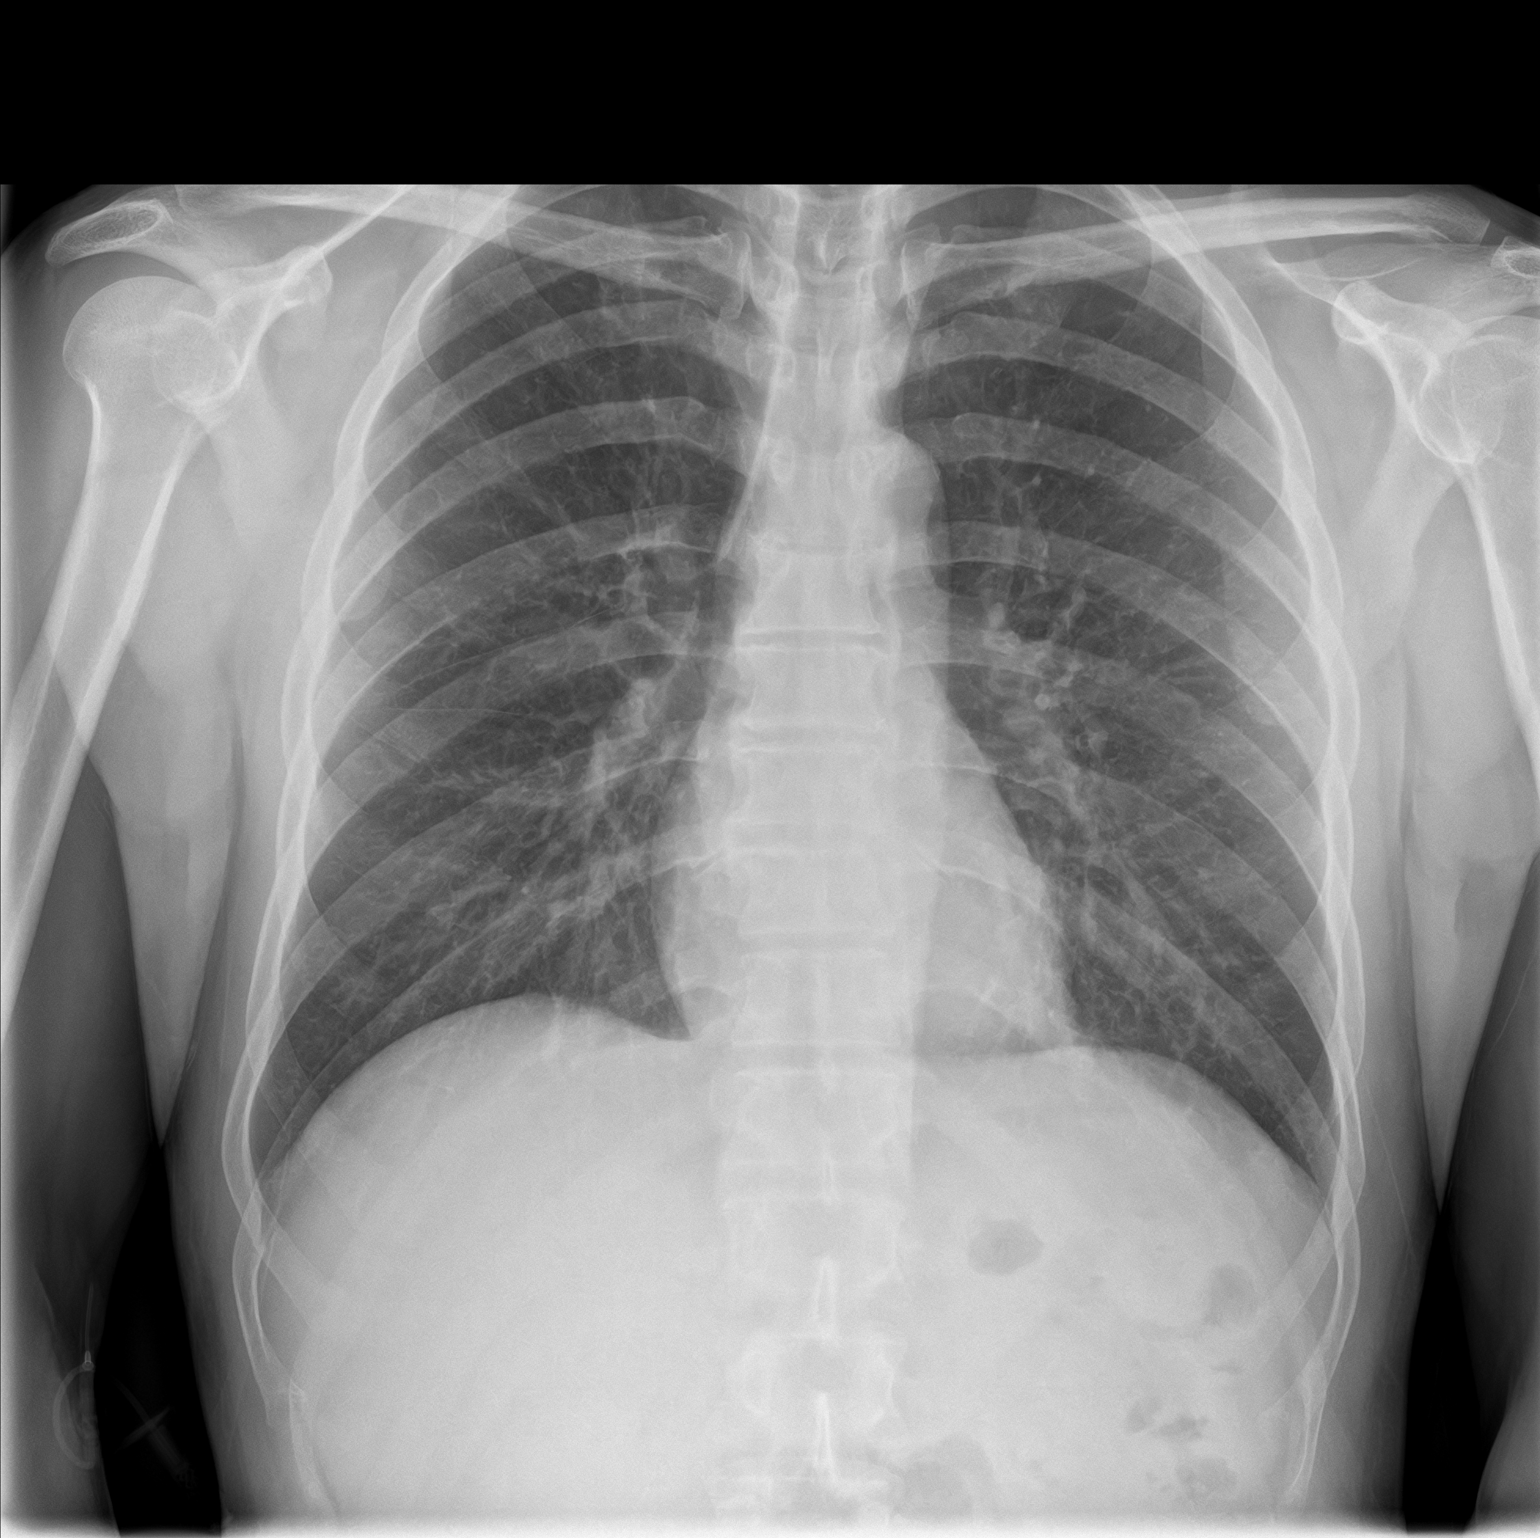
[im 2/2]
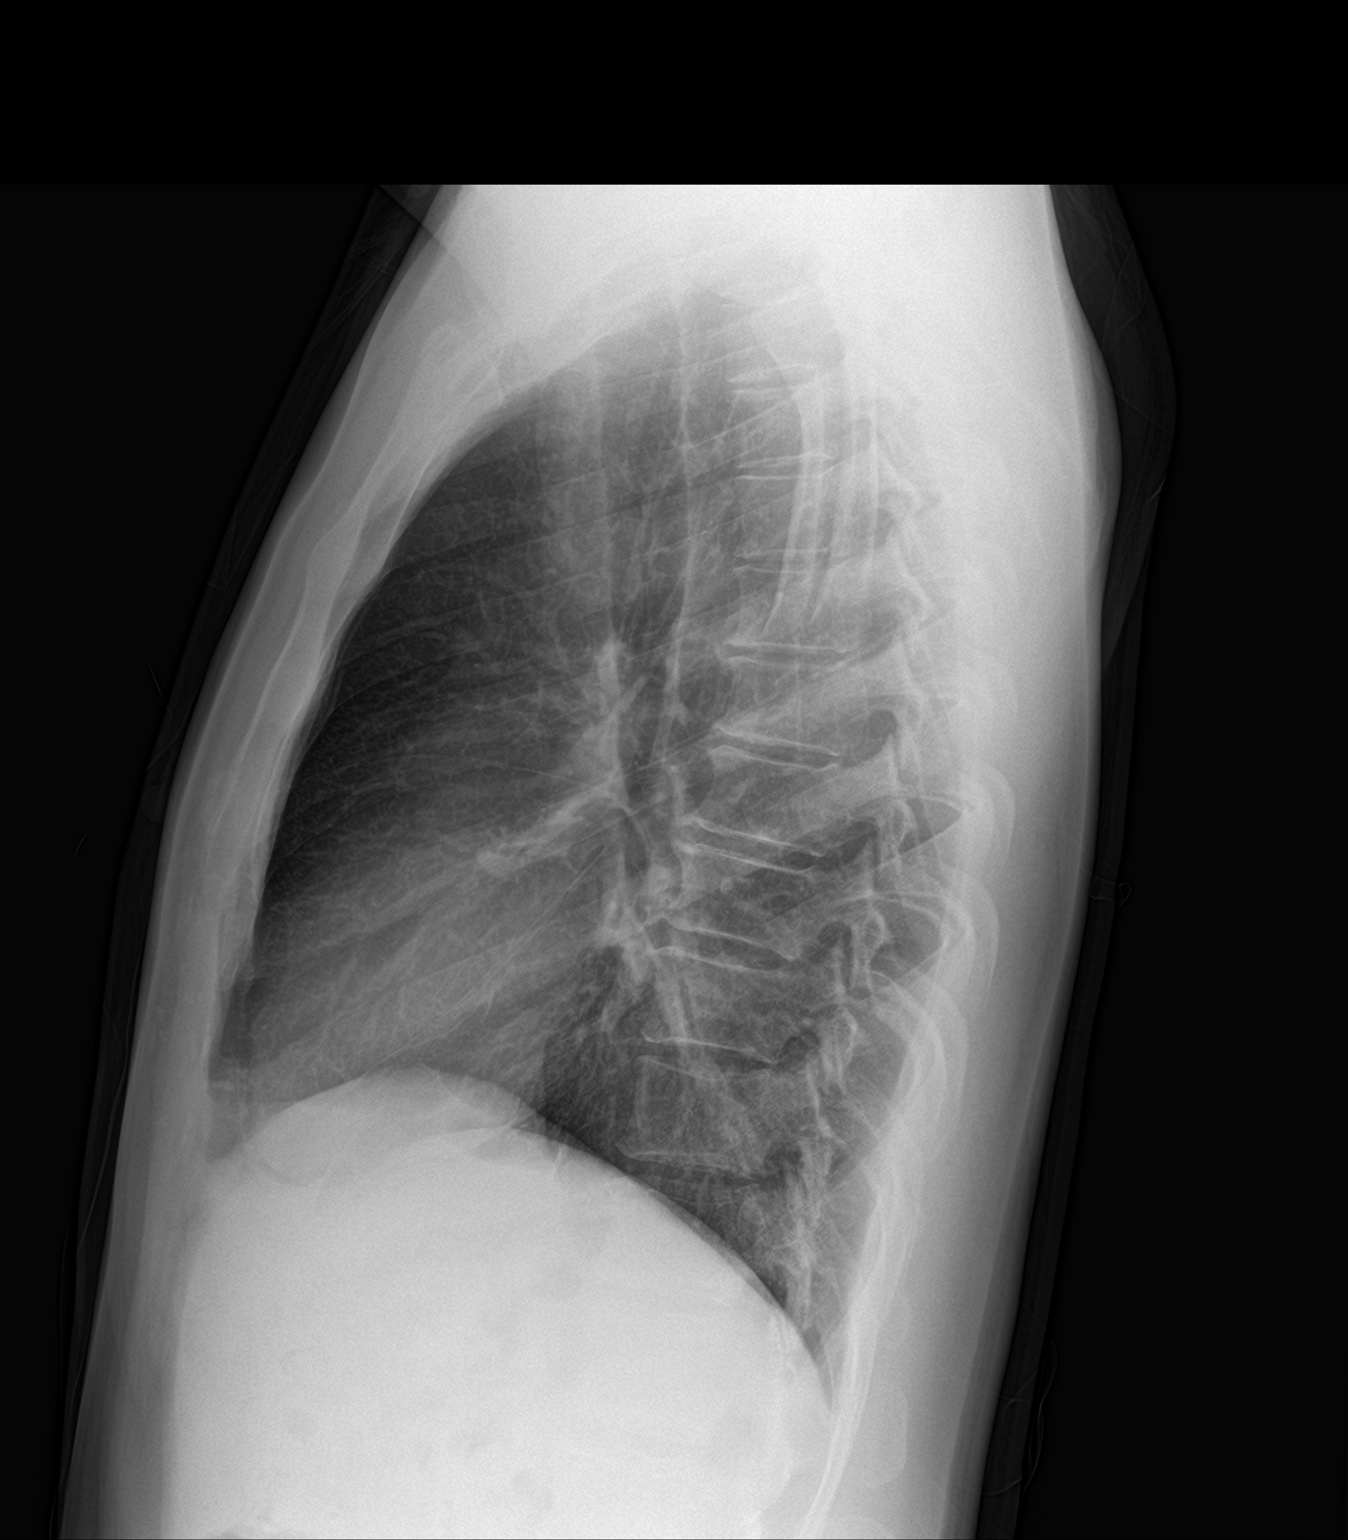

[2 of 2 positions shown; findings below may reference images not displayed]

FINDINGS: The heart size and mediastinal contours are within normal limits.
Both lungs are clear. The visualized skeletal structures are
unremarkable.
IMPRESSION: No active cardiopulmonary disease.

## 2023-02-28 IMAGING — CT CT NECK W/ CM
4 of 5 series · 14 of 33 positions shown, 16 images · IV contrast (omnipaque)
Comparison: None.

CLINICAL DATA: Abscess on right-side of face under ear getting
bigger, denies pain

EXAM:
CT NECK WITH CONTRAST
TECHNIQUE: Multidetector CT imaging of the neck was performed using the
standard protocol following the bolus administration of intravenous
contrast.
CONTRAST:  75mL OMNIPAQUE IOHEXOL 300 MG/ML  SOLN

[Series 2: axial neck · axial · 0.50mm/px · z∈[-250,-142]mm · 3 of 110 slices shown]
[im 28/110  bone]
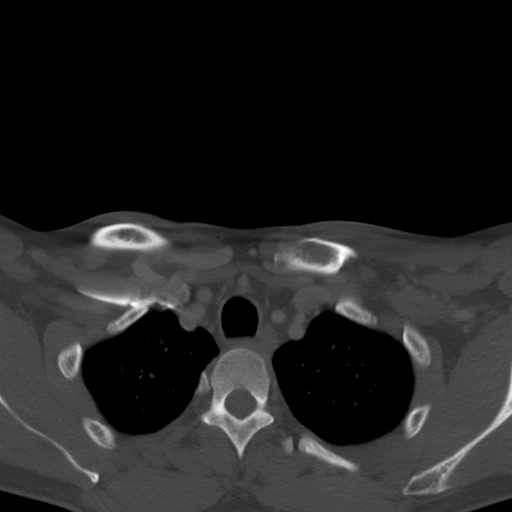
[im 55/110  bone]
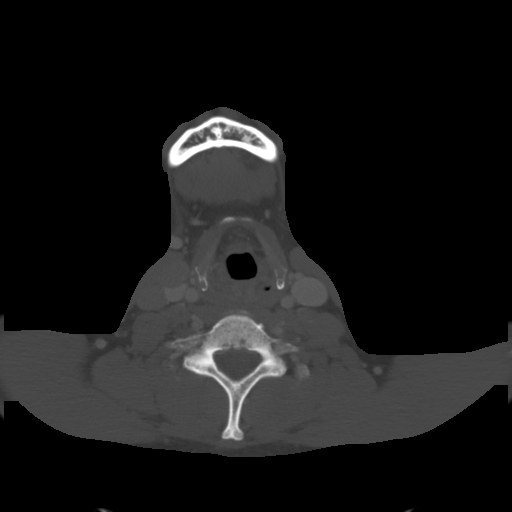
[im 82/110  bone]
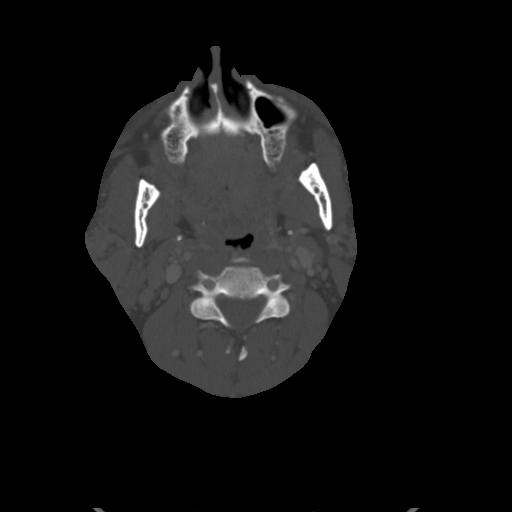

[Series 5: sag neck · sagittal · 0.48mm/px · 5 of 121 slices shown, 6 images]
[im 41/121  bone]
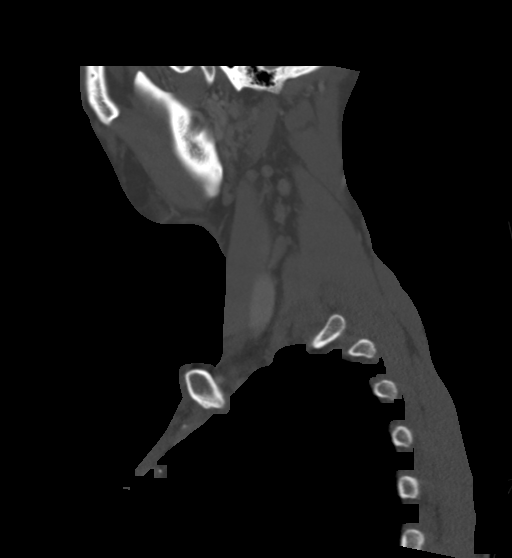
[im 51/121  bone]
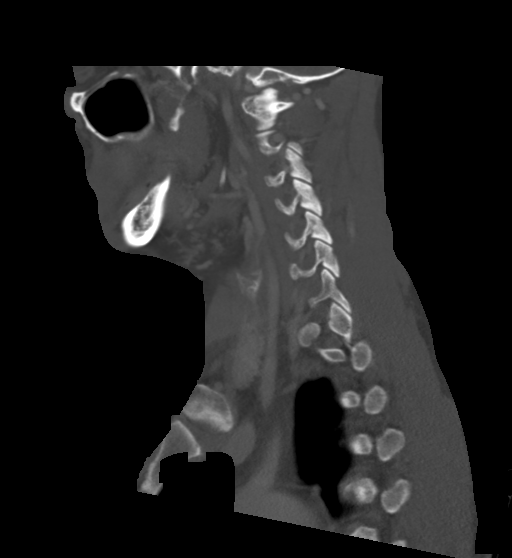
[im 61/121  soft-tissue]
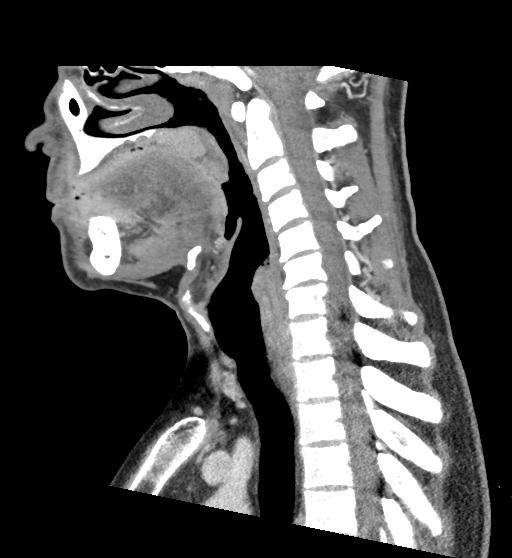
[im 61/121  bone]
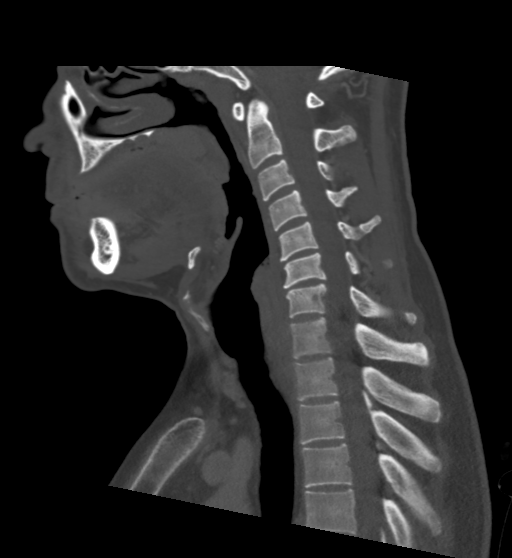
[im 71/121  bone]
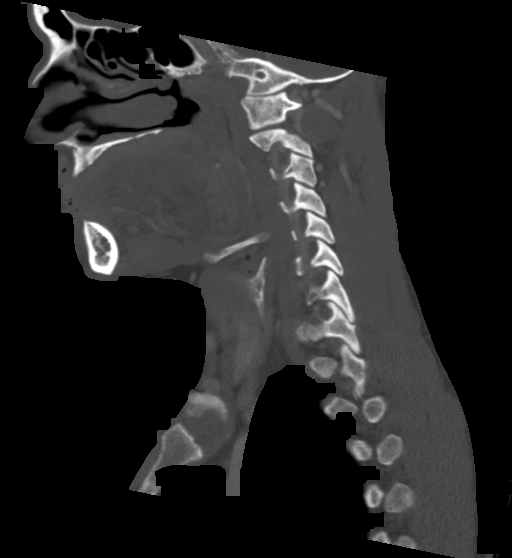
[im 81/121  bone]
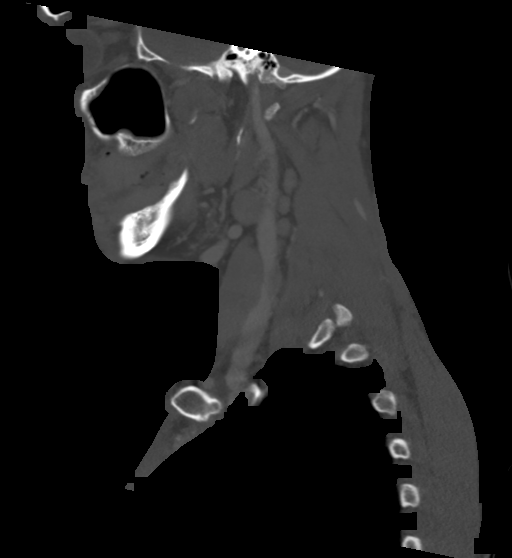

[Series 6: cor neck · coronal · 0.47mm/px · 3 of 123 slices shown]
[im 26/123  bone]
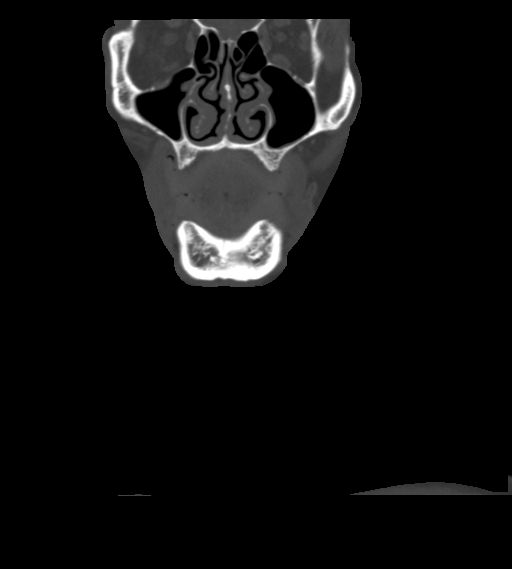
[im 50/123  bone]
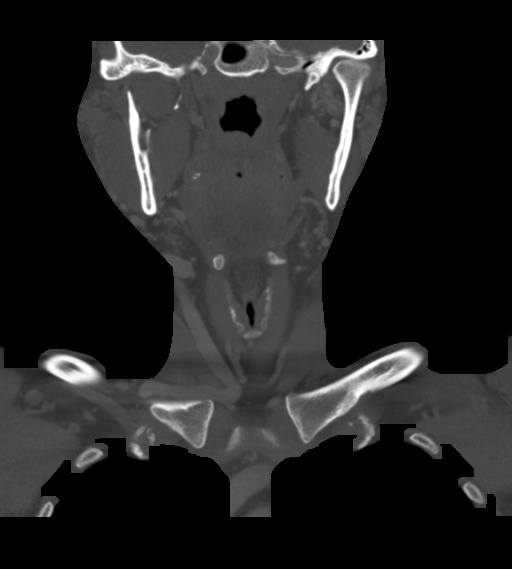
[im 74/123  bone]
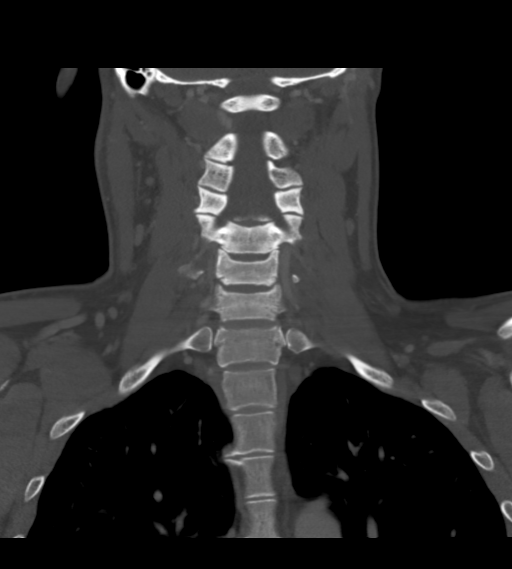

[Series 7: orthogonal (person_name) · axial · 0.47mm/px · z∈[-285,-154]mm · 3 of 135 slices shown, 4 images]
[im 34/135  soft-tissue]
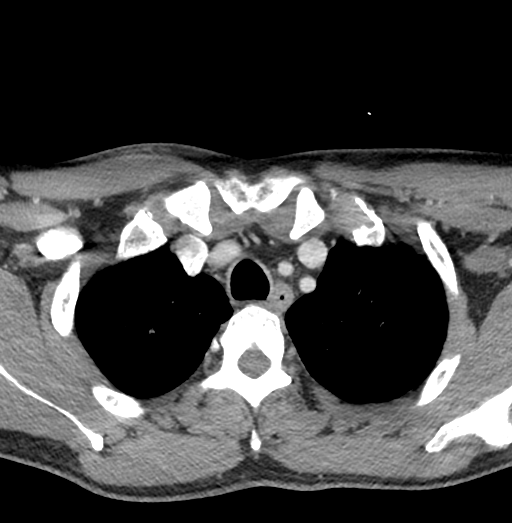
[im 34/135  bone]
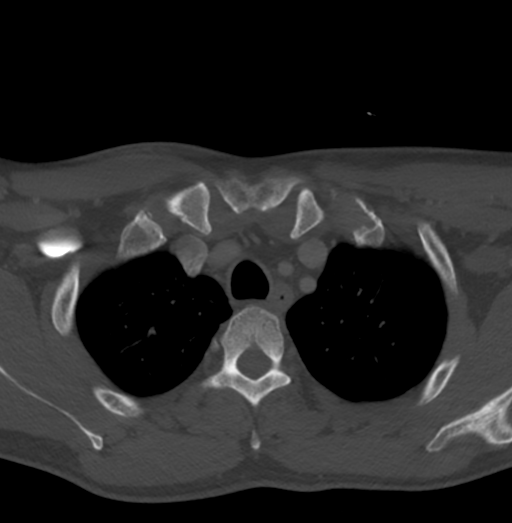
[im 68/135  bone]
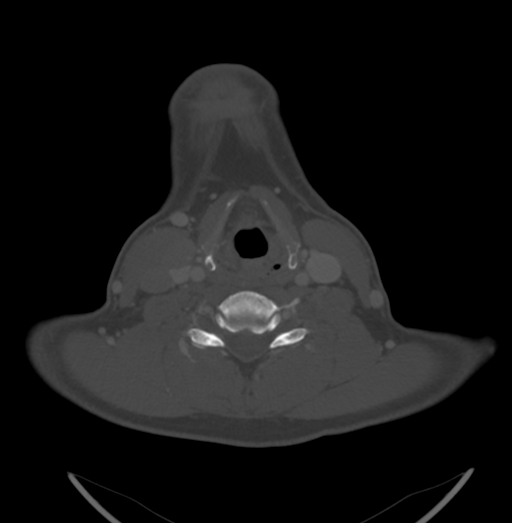
[im 101/135  bone]
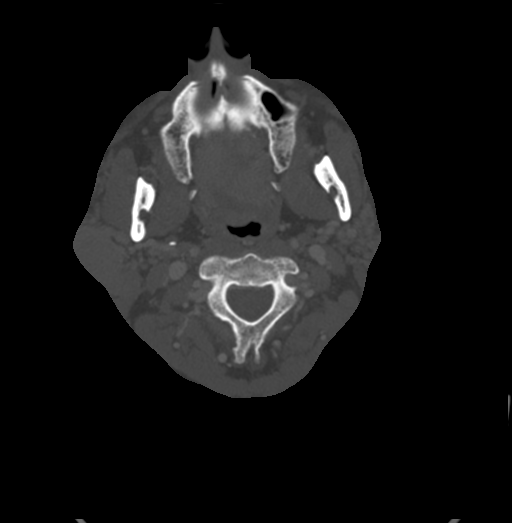

[14 of 33 positions shown; findings below may reference images not displayed]

FINDINGS: Pharynx and larynx: The nasal cavity and nasopharynx are
unremarkable.

There is a 1.4 cm cc by 1.8 cm TV by approximately 2.1 cm AP
enhancing lesion in the left aspect of the soft palate (6-45, 2-26).
There is no evidence of osseous erosion involving the adjacent hard
palate. The oral cavity and oropharynx are otherwise unremarkable.
The parapharyngeal spaces are clear.

The hypopharynx and larynx are unremarkable. The vocal folds are
normal.

Salivary glands: There is a 3.6 cm AP by 2.9 cm TV by 3.8 cm cc
solid lesion centered in the right parotid gland. The left parotid
gland is abnormal in appearance with hyperdensity and interspersed
fat. There is fatty atrophy of the submandibular glands which are
essentially entirely replaced.

Thyroid: Unremarkable.

Lymph nodes: There are multiple enlarged right cervical chain lymph
nodes:

*1.4 cm x 1.6 cm by 1.8 cm right level IIa node with suspected
internal necrosis but no definite extracapsular extension (2-36).
*1.2 cm by 1.9 cm by 1.5 cm right level IIa lymph node just
anteroinferior to the above-described node (2-34).
*1.0 cm by 1.6 cm by 1.2 cm right level IIa node (2-31).
*A few right level II B nodes measuring up to 6 mm in short axis.
*1.2 cm x 1.5 cm by 1.6 cm right level IIa node (2-38).
*Right level III nodes measuring 1.1 cm by 1.7 cm by 2.6 cm (2-55).
And 1.2 cm by 1.4 cm by 1.8 cm (2-56).
*Multiple prominent supraclavicular lymph nodes measuring up to 7 mm
in short axis (2-68).

On the left, there is a 0.9 cm x 1.1 cm by 1.5 cm level II a node
(2-37). There are scattered additional subcentimeter cervical chain
lymph nodes on the left.

Vascular: There is mass effect on the right internal jugular vein by
the above-described right level III nodes without evidence of
occlusion. The vasculature is otherwise unremarkable.

Limited intracranial: The partially imaged intracranial compartment
is unremarkable.

Visualized orbits: The imaged globes and orbits are unremarkable.

Mastoids and visualized paranasal sinuses: There is mild mucosal
thickening in the imaged paranasal sinuses. The imaged mastoid air
cells are clear.

Skeleton: There is no aggressive osseous lesion to suggest osseous
metastatic disease.

Upper chest: The imaged lung apices are clear.

Other: None.
IMPRESSION: 1. Enhancing lesion in the soft palate described above suspicious
for primary malignancy. There is bulky lymphadenopathy in the right
neck with a large mass centered in the right parotid gland likely
reflecting a metastatic lymph node, likely corresponding to the
palpable abnormality. Some of the nodes on the right appear necrotic
without evidence of extracapsular extension. Recommend ENT
consultation.
2. Prominent left level II a lymph node measuring up to 0.9 cm in
short axis is indeterminate but may also be metastatic.
3. Abnormal appearance of the bilateral parotid glands and fatty
atrophy of the submandibular glands may reflect systemic process
such as Patrick.

## 2023-03-27 IMAGING — US US BIOPSY LYMPH NODE
1 series · 13 of 14 positions shown · non-contrast
Comparison: none

INDICATION: Right-sided parotid mass versus pathologic lymph node

[Series 1: us biopsy lymph node · 0.08mm/px · 13 of 14 slices shown]
[im 1/14]
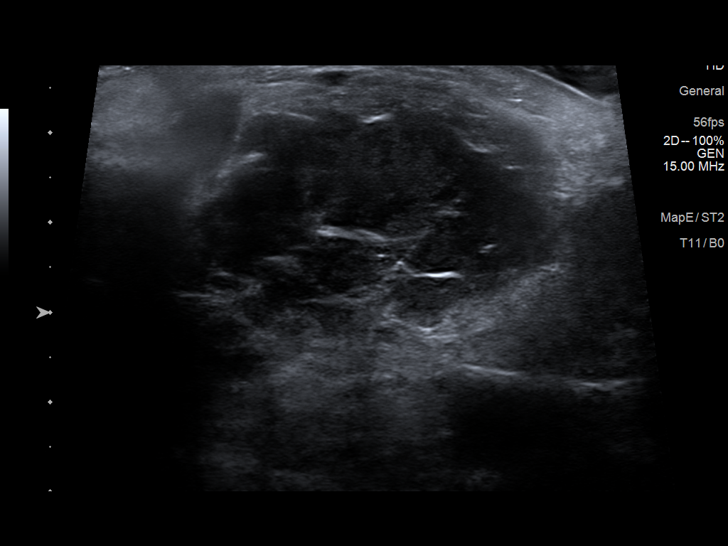
[im 2/14]
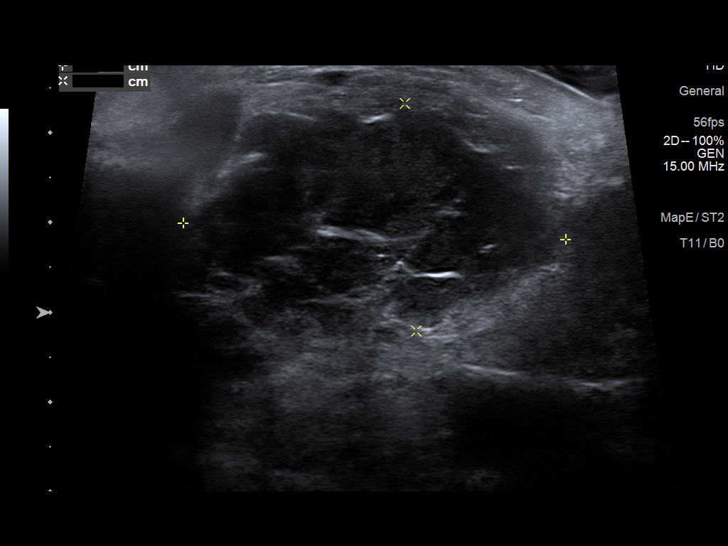
[im 3/14]
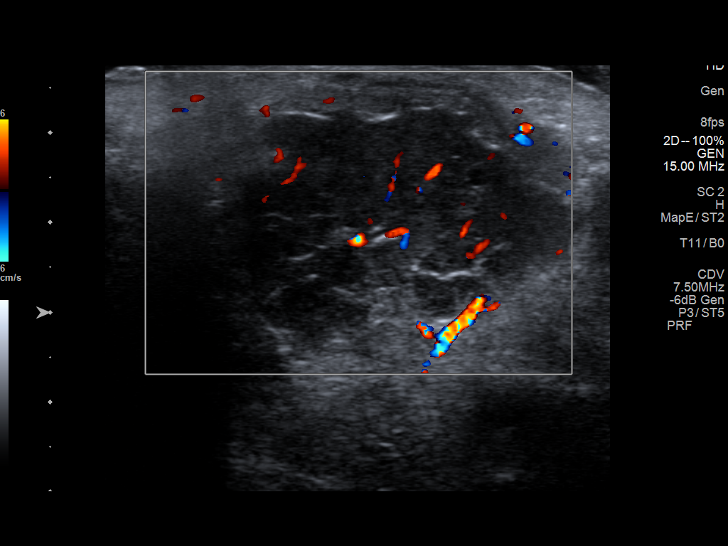
[im 4/14]
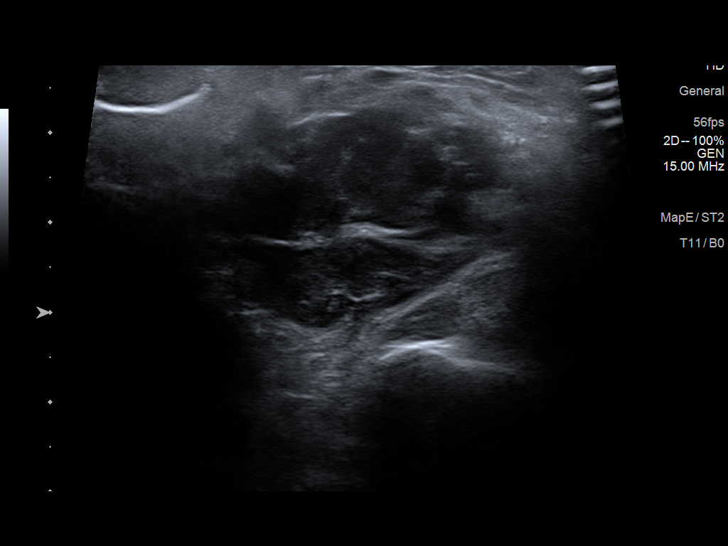
[im 5/14]
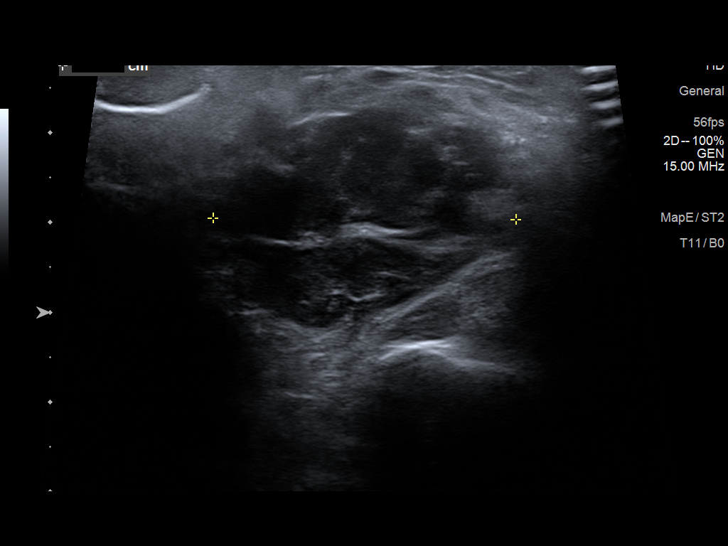
[im 6/14]
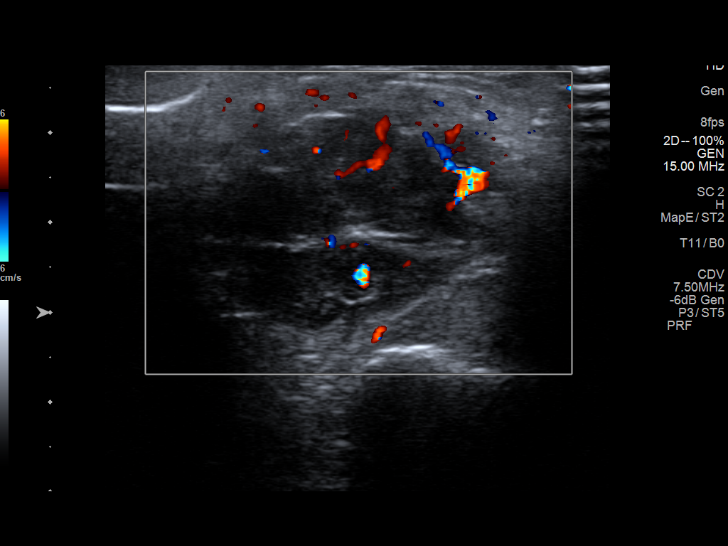
[im 8/14]
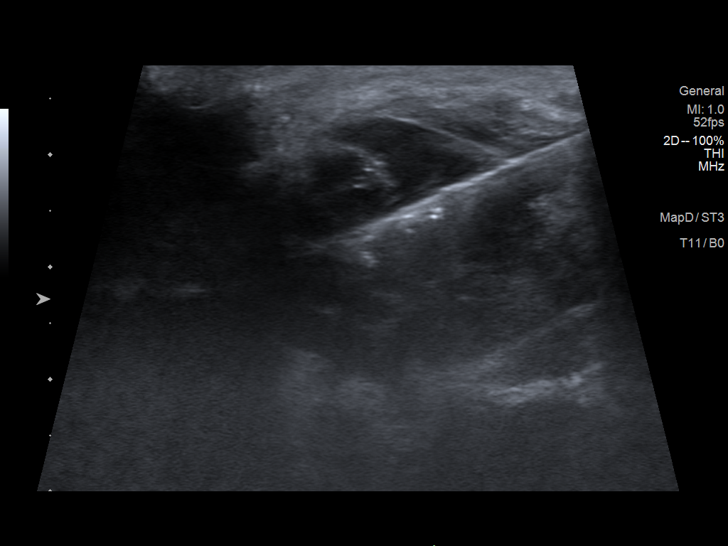
[im 9/14]
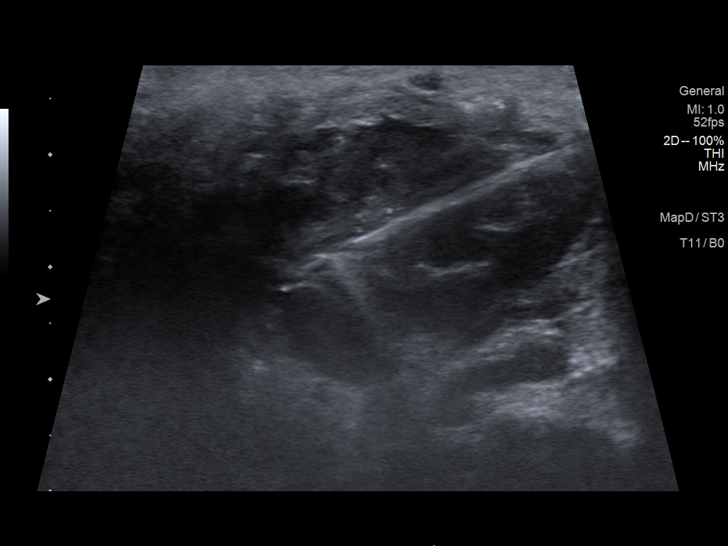
[im 10/14]
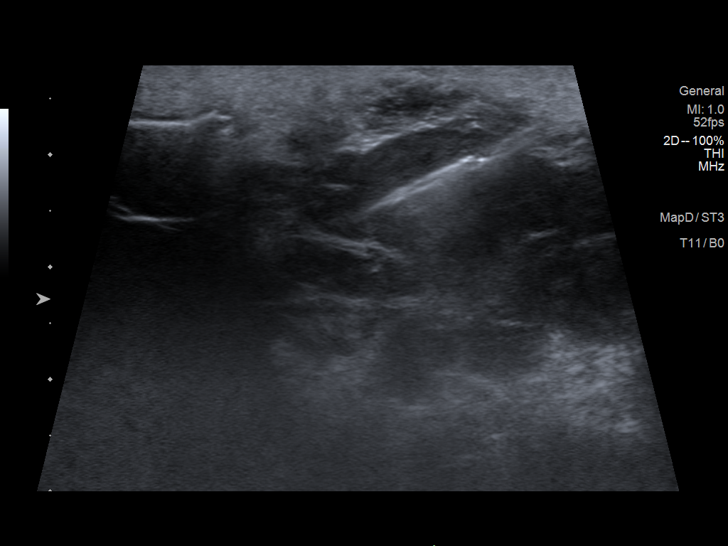
[im 11/14]
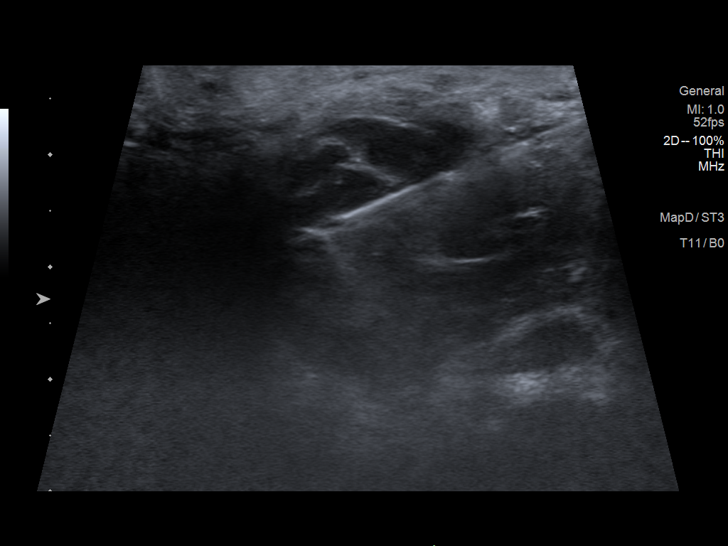
[im 12/14]
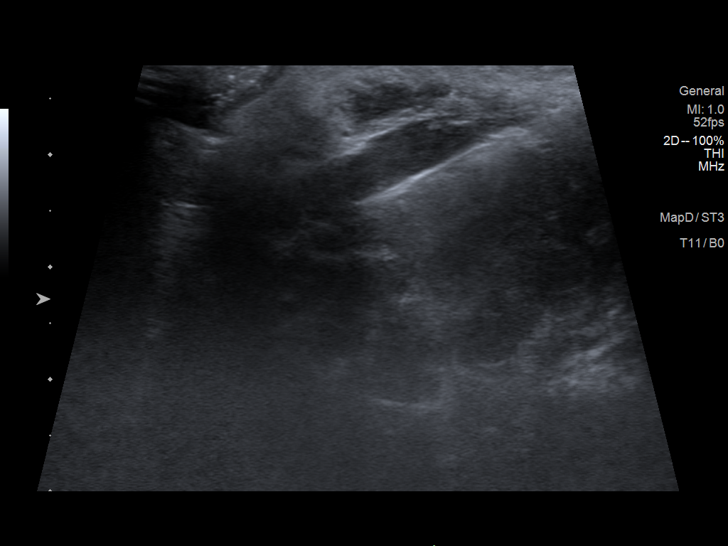
[im 13/14]
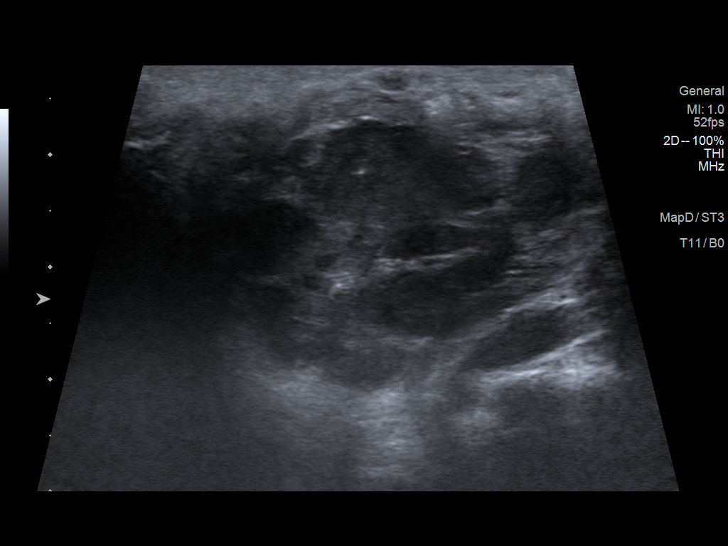
[im 14/14]
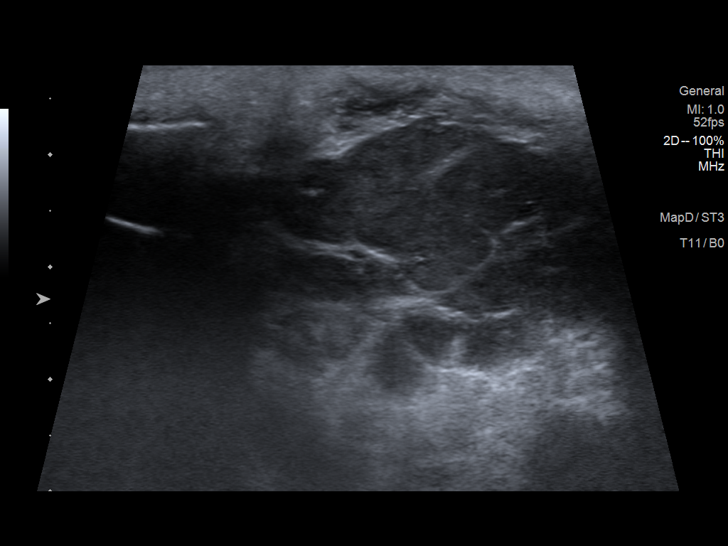

[13 of 14 positions shown; findings below may reference images not displayed]

EXAM:
Ultrasound-guided core needle biopsy of right-sided submandibular
mass

MEDICATIONS:
None.

ANESTHESIA/SEDATION:
Local analgesia

FLUOROSCOPY TIME:  N/a

COMPLICATIONS:
None immediate.

PROCEDURE:
Informed written consent was obtained from the patient after a
thorough discussion of the procedural risks, benefits and
alternatives. All questions were addressed. Maximal Sterile Barrier
Technique was utilized including caps, mask, sterile gowns, sterile
gloves, sterile drape, hand hygiene and skin antiseptic. A timeout
was performed prior to the initiation of the procedure.

The patient was placed supine on the exam table. Limited ultrasound
of the right submandibular region was performed. This again
demonstrated a lobulated and heterogeneous mass in the area of the
right parotid gland, compatible with right parotid mass versus
pathologic lymphadenopathy/metastatic involvement. Skin entry site
was marked, and the overlying skin was prepped and draped in the
standard sterile fashion. Local analgesia was obtained with 1%
lidocaine. Under ultrasound guidance, core needle biopsy was
performed of the identified lesion using an 18 gauge core biopsy
device. A total of 6 passes were made, and specimens were submitted
in saline to pathology for further handling. Limited postprocedure
imaging demonstrated expected post biopsy changes without hematoma
or complicating feature. After hemostasis manual pressure, clean
dressing was placed. The patient tolerated the procedure well
without immediate complication.
IMPRESSION: Successful ultrasound-guided core needle biopsy of right
submandibular mass.

## 2023-04-16 ENCOUNTER — Other Ambulatory Visit: Payer: Self-pay

## 2023-04-16 DIAGNOSIS — C884 Extranodal marginal zone B-cell lymphoma of mucosa-associated lymphoid tissue [MALT-lymphoma]: Secondary | ICD-10-CM

## 2023-04-17 ENCOUNTER — Inpatient Hospital Stay: Payer: Self-pay

## 2023-04-17 ENCOUNTER — Encounter: Payer: Self-pay | Admitting: Oncology

## 2023-04-17 ENCOUNTER — Inpatient Hospital Stay: Payer: Self-pay | Attending: Oncology | Admitting: Oncology

## 2023-04-17 VITALS — BP 115/72 | HR 60 | Temp 97.6°F | Resp 20 | Wt 149.2 lb

## 2023-04-17 DIAGNOSIS — C8309 Small cell B-cell lymphoma, extranodal and solid organ sites: Secondary | ICD-10-CM | POA: Insufficient documentation

## 2023-04-17 DIAGNOSIS — C884 Extranodal marginal zone B-cell lymphoma of mucosa-associated lymphoid tissue [MALT-lymphoma]: Secondary | ICD-10-CM

## 2023-04-17 LAB — CMP (CANCER CENTER ONLY)
ALT: 27 U/L (ref 0–44)
AST: 24 U/L (ref 15–41)
Albumin: 4 g/dL (ref 3.5–5.0)
Alkaline Phosphatase: 76 U/L (ref 38–126)
Anion gap: 6 (ref 5–15)
BUN: 12 mg/dL (ref 6–20)
CO2: 25 mmol/L (ref 22–32)
Calcium: 8.6 mg/dL — ABNORMAL LOW (ref 8.9–10.3)
Chloride: 105 mmol/L (ref 98–111)
Creatinine: 0.91 mg/dL (ref 0.61–1.24)
GFR, Estimated: >60 mL/min (ref >60)
Glucose, Bld: 108 mg/dL — ABNORMAL HIGH (ref 70–99)
Potassium: 4.2 mmol/L (ref 3.5–5.1)
Sodium: 136 mmol/L (ref 135–145)
Total Bilirubin: 0.5 mg/dL (ref 0.3–1.2)
Total Protein: 7.9 g/dL (ref 6.5–8.1)

## 2023-04-17 LAB — CBC WITH DIFFERENTIAL (CANCER CENTER ONLY)
Abs Immature Granulocytes: 0.01 10*3/uL (ref 0.00–0.07)
Basophils Absolute: 0 10*3/uL (ref 0.0–0.1)
Basophils Relative: 1 %
Eosinophils Absolute: 0.3 10*3/uL (ref 0.0–0.5)
Eosinophils Relative: 7 %
HCT: 37.5 % — ABNORMAL LOW (ref 39.0–52.0)
Hemoglobin: 13 g/dL (ref 13.0–17.0)
Immature Granulocytes: 0 %
Lymphocytes Relative: 32 %
Lymphs Abs: 1.3 10*3/uL (ref 0.7–4.0)
MCH: 32.2 pg (ref 26.0–34.0)
MCHC: 34.7 g/dL (ref 30.0–36.0)
MCV: 92.8 fL (ref 80.0–100.0)
Monocytes Absolute: 0.4 10*3/uL (ref 0.1–1.0)
Monocytes Relative: 9 %
Neutro Abs: 2.1 10*3/uL (ref 1.7–7.7)
Neutrophils Relative %: 51 %
Platelet Count: 219 10*3/uL (ref 150–400)
RBC: 4.04 MIL/uL — ABNORMAL LOW (ref 4.22–5.81)
RDW: 12.2 % (ref 11.5–15.5)
WBC Count: 4.2 10*3/uL (ref 4.0–10.5)
nRBC: 0 % (ref 0.0–0.2)

## 2023-04-17 LAB — LACTATE DEHYDROGENASE: LDH: 119 U/L (ref 98–192)

## 2023-04-18 NOTE — Progress Notes (Signed)
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  He does not have any known medical problems. He was noted to have swelling involving his right cheek that has been ongoing for the last 2 to 3 months.  Patient is here with his son today who reports that he feels that patient has lost a lot of weight in the last 3 months.  Patient endorses that his appetite is poor.  Denies any significant pain.   Patient was seen by ENT and underwent CT soft tissue neck which showed an enhancing lesion in the soft palate concerning for primary malignancy.  Bulky lymphadenopathy in the right neck with large mass centered in the right parotid gland multiple enlarged right cervical chain lymph nodes measuring up to 2 cm in size.  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  Ki-67 10 to 20%.  Overall immunoreactivity supports a differential diagnosis of extranodal marginal zone lymphoma versus lymphoplasmacytic lymphoma.  NYD 88 testing on the biopsy specimen was negative.   Patient had 4 weekly rituxan cycles with excellent response  Interval history- history obtained with the help of spanish interpretor. He is doing well overall and denies any complaints   ECOG PS- 0 Pain  scale- 0   Review of systems- Review of Systems  Constitutional:  Negative for chills, fever, malaise/fatigue and weight loss.  HENT:  Negative for congestion, ear discharge and nosebleeds.   Eyes:  Negative for blurred vision.  Respiratory:  Negative for cough, hemoptysis, sputum production, shortness of breath and wheezing.   Cardiovascular:  Negative for chest pain, palpitations, orthopnea and claudication.  Gastrointestinal:  Negative for abdominal pain, blood in stool, constipation, diarrhea, heartburn, melena, nausea and vomiting.  Genitourinary:  Negative for dysuria, flank pain, frequency, hematuria and urgency.  Musculoskeletal:  Negative for back pain, joint pain and myalgias.  Skin:  Negative for rash.  Neurological:  Negative for dizziness, tingling, focal weakness, seizures, weakness and headaches.  Endo/Heme/Allergies:  Does not bruise/bleed easily.  Psychiatric/Behavioral:  Negative for depression and suicidal ideas. The patient does not have insomnia.       No Known Allergies   Past Medical History:  Diagnosis Date   History of COVID-19 2020   Lymphoma ([facility]) 10/30/2021   low grade b cell lymphoma   Mass of right parotid gland      History reviewed. No pertinent surgical history.  Social History   Socioeconomic History   Marital status: Married    Spouse name: Not on file   Number of children: Not on file   Years of education: Not on file   Highest education level: Not on file  Occupational History   Not on file  Tobacco Use   Smoking status: Never    Passive exposure: Never   Smokeless tobacco: Never  Vaping Use  Vaping status: Never Used  Substance and Sexual Activity   Alcohol use: Never   Drug use: Never   Sexual activity: Yes  Other Topics Concern   Not on file  Social History Narrative   Not on file   Social Determinants of Health   Financial Resource Strain: Medium Risk (12/24/2021)   Overall Financial Resource Strain (CARDIA)     Difficulty of Paying Living Expenses: Somewhat hard  Food Insecurity: No Food Insecurity (12/24/2021)   Hunger Vital Sign    Worried About Running Out of Food in the Last Year: Never true    Ran Out of Food in the Last Year: Never true  Transportation Needs: No Transportation Needs (12/24/2021)   PRAPARE - Administrator, Civil Service (Medical): No    Lack of Transportation (Non-Medical): No  Physical Activity: Inactive (12/24/2021)   Exercise Vital Sign    Days of Exercise per Week: 0 days    Minutes of Exercise per Session: 0 min  Stress: Stress Concern Present (12/24/2021)   [employer] of Occupational Health - Occupational Stress Questionnaire    Feeling of Stress : To some extent  Social Connections: Moderately Isolated (12/24/2021)   Social Connection and Isolation Panel [NHANES]    Frequency of Communication with Friends and Family: Three times a week    Frequency of Social Gatherings with Friends and Family: Three times a week    Attends Religious Services: Never    Active Member of Clubs or Organizations: No    Attends Banker Meetings: Never    Marital Status: Married  Catering manager Violence: Not At Risk (12/24/2021)   Humiliation, Afraid, Rape, and Kick questionnaire    Fear of Current or Ex-Partner: No    Emotionally Abused: No    Physically Abused: No    Sexually Abused: No    History reviewed. No pertinent family history.   Current Outpatient Medications:    acetaminophen (TYLENOL) 500 MG tablet, Take 500 mg by mouth daily. (Patient not taking: Reported on 04/17/2023), Disp: , Rfl:   Physical exam:  Vitals:   04/17/23 1411  BP: 115/72  Pulse: 60  Resp: 20  Temp: 97.6 F (36.4 C)  SpO2: 100%  Weight: 149 lb 3.2 oz (67.7 kg)   Physical Exam Cardiovascular:     Rate and Rhythm: Normal rate and regular rhythm.     Heart sounds: Normal heart sounds.  Pulmonary:     Effort: Pulmonary effort is normal.     Breath sounds: Normal  breath sounds.  Abdominal:     General: Bowel sounds are normal.     Palpations: Abdomen is soft.  Skin:    General: Skin is warm and dry.  Neurological:     Mental Status: He is alert and oriented to person, place, and time.         Latest Ref Rng & Units 04/17/2023    1:53 PM  CMP  Glucose 70 - 99 mg/dL 161   BUN 6 - 20 mg/dL 12   Creatinine 0.96 - 1.24 mg/dL 0.45   Sodium 409 - 811 mmol/L 136   Potassium 3.5 - 5.1 mmol/L 4.2   Chloride 98 - 111 mmol/L 105   CO2 22 - 32 mmol/L 25   Calcium 8.9 - 10.3 mg/dL 8.6   Total Protein 6.5 - 8.1 g/dL 7.9   Total Bilirubin 0.3 - 1.2 mg/dL 0.5   Alkaline Phos 38 - 126 U/L 76   AST 15 -  41 U/L 24   ALT 0 - 44 U/L 27       Latest Ref Rng & Units 04/17/2023    1:52 PM  CBC  WBC 4.0 - 10.5 K/uL 4.2   Hemoglobin 13.0 - 17.0 g/dL 62.9   Hematocrit 52.8 - 52.0 % 37.5   Platelets 150 - 400 K/uL 219      Assessment and plan- Patient is a 52 y.o. male here for routine f/u of extranodal marginal zone lymphoma   Clinically patient is doing well with no concerning signs and symptoms of recurrence on todays exam. No B symptoms. Labs are unremarkable. I will see him back in 6 months with labs. Imaging only if suspicious signs and symptoms   Visit Diagnosis 1. Extranodal marginal zone B-cell lymphoma (HCC)      Dr. Owens Shark, MD, MPH [facility] at [facility] [phone] 04/18/2023 11:02 AM

## 2023-10-20 ENCOUNTER — Inpatient Hospital Stay: Payer: Self-pay | Attending: Oncology

## 2023-10-20 ENCOUNTER — Encounter: Payer: Self-pay | Admitting: Oncology

## 2023-10-20 ENCOUNTER — Inpatient Hospital Stay (HOSPITAL_BASED_OUTPATIENT_CLINIC_OR_DEPARTMENT_OTHER): Payer: Self-pay | Admitting: Oncology

## 2023-10-20 VITALS — BP 109/77 | HR 65 | Temp 95.9°F | Resp 18 | Wt 145.1 lb

## 2023-10-20 DIAGNOSIS — Z8572 Personal history of non-Hodgkin lymphomas: Secondary | ICD-10-CM

## 2023-10-20 DIAGNOSIS — C884 Extranodal marginal zone b-cell lymphoma of mucosa-associated lymphoid tissue (malt-lymphoma) not having achieved remission: Secondary | ICD-10-CM

## 2023-10-20 DIAGNOSIS — Z08 Encounter for follow-up examination after completed treatment for malignant neoplasm: Secondary | ICD-10-CM

## 2023-10-20 LAB — CBC WITH DIFFERENTIAL/PLATELET
Abs Immature Granulocytes: 0.01 10*3/uL (ref 0.00–0.07)
Basophils Absolute: 0 10*3/uL (ref 0.0–0.1)
Basophils Relative: 1 %
Eosinophils Absolute: 0.3 10*3/uL (ref 0.0–0.5)
Eosinophils Relative: 7 %
HCT: 37.4 % — ABNORMAL LOW (ref 39.0–52.0)
Hemoglobin: 13.2 g/dL (ref 13.0–17.0)
Immature Granulocytes: 0 %
Lymphocytes Relative: 30 %
Lymphs Abs: 1.2 10*3/uL (ref 0.7–4.0)
MCH: 31.8 pg (ref 26.0–34.0)
MCHC: 35.3 g/dL (ref 30.0–36.0)
MCV: 90.1 fL (ref 80.0–100.0)
Monocytes Absolute: 0.4 10*3/uL (ref 0.1–1.0)
Monocytes Relative: 10 %
Neutro Abs: 2.2 10*3/uL (ref 1.7–7.7)
Neutrophils Relative %: 52 %
Platelets: 270 10*3/uL (ref 150–400)
RBC: 4.15 MIL/uL — ABNORMAL LOW (ref 4.22–5.81)
RDW: 11.8 % (ref 11.5–15.5)
WBC: 4.2 10*3/uL (ref 4.0–10.5)
nRBC: 0 % (ref 0.0–0.2)

## 2023-10-20 LAB — COMPREHENSIVE METABOLIC PANEL
ALT: 36 U/L (ref 0–44)
AST: 26 U/L (ref 15–41)
Albumin: 4.1 g/dL (ref 3.5–5.0)
Alkaline Phosphatase: 69 U/L (ref 38–126)
Anion gap: 7 (ref 5–15)
BUN: 14 mg/dL (ref 6–20)
CO2: 24 mmol/L (ref 22–32)
Calcium: 9.2 mg/dL (ref 8.9–10.3)
Chloride: 104 mmol/L (ref 98–111)
Creatinine, Ser: 0.95 mg/dL (ref 0.61–1.24)
GFR, Estimated: >60 mL/min (ref >60)
Glucose, Bld: 120 mg/dL — ABNORMAL HIGH (ref 70–99)
Potassium: 4.1 mmol/L (ref 3.5–5.1)
Sodium: 135 mmol/L (ref 135–145)
Total Bilirubin: 0.8 mg/dL (ref 0.0–1.2)
Total Protein: 8.2 g/dL — ABNORMAL HIGH (ref 6.5–8.1)

## 2023-10-20 NOTE — Progress Notes (Signed)
 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  He does not have any known medical problems. He was noted to have swelling involving his right cheek that has been ongoing for the last 2 to 3 months.  Patient is here with his son today who reports that he feels that patient has lost a lot of weight in the last 3 months.  Patient endorses that his appetite is poor.  Denies any significant pain.   Patient was seen by ENT and underwent CT soft tissue neck which showed an enhancing lesion in the soft palate concerning for primary malignancy.  Bulky lymphadenopathy in the right neck with large mass centered in the right parotid gland multiple enlarged right cervical chain lymph nodes measuring up to 2 cm in size.  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  Ki-67 10 to 20%.  Overall immunoreactivity supports a differential diagnosis of extranodal marginal zone lymphoma versus lymphoplasmacytic lymphoma.  NYD 88 testing on the biopsy specimen was negative.   Patient had 4 weekly rituxan  cycles ending in July 2023 and is currently in remission  Interval history-history obtained with the help of Spanish interpreter.  Appetite and weight have remained stable.   Denies any new aches and pains anywhere.  Denies any palpable lumps  ECOG PS- 1 Pain scale- 0   Review of systems- Review of Systems  Constitutional:  Negative for chills, fever, malaise/fatigue and weight loss.  HENT:  Negative for congestion, ear discharge and nosebleeds.   Eyes:  Negative for blurred vision.  Respiratory:  Negative for cough, hemoptysis, sputum production, shortness of breath and wheezing.   Cardiovascular:  Negative for chest pain, palpitations, orthopnea and claudication.  Gastrointestinal:  Negative for abdominal pain, blood in stool, constipation, diarrhea, heartburn, melena, nausea and vomiting.  Genitourinary:  Negative for dysuria, flank pain, frequency, hematuria and urgency.  Musculoskeletal:  Negative for back pain, joint pain and myalgias.  Skin:  Negative for rash.  Neurological:  Negative for dizziness, tingling, focal weakness, seizures, weakness and headaches.  Endo/Heme/Allergies:  Does not bruise/bleed easily.  Psychiatric/Behavioral:  Negative for depression and suicidal ideas. The patient does not have insomnia.       No Known Allergies   Past Medical History:  Diagnosis Date   History of COVID-19 2020   Lymphoma (HCC) 10/30/2021   low grade b cell lymphoma   Mass of right parotid gland      History reviewed. No pertinent surgical history.  Social History   Socioeconomic History   Marital status: Married    Spouse name: Not on file   Number of children: Not on file   Years of education: Not on file   Highest education level: Not on file  Occupational History   Not on file  Tobacco Use   Smoking status: Never  Passive exposure: Never   Smokeless tobacco: Never  Vaping Use   Vaping status: Never Used  Substance and Sexual Activity   Alcohol use: Never   Drug use: Never   Sexual activity: Yes  Other Topics Concern   Not on file  Social History Narrative   Not on file   Social Drivers of Health   Financial Resource  Strain: Medium Risk (12/24/2021)   Overall Financial Resource Strain (CARDIA)    Difficulty of Paying Living Expenses: Somewhat hard  Food Insecurity: No Food Insecurity (12/24/2021)   Hunger Vital Sign    Worried About Running Out of Food in the Last Year: Never true    Ran Out of Food in the Last Year: Never true  Transportation Needs: No Transportation Needs (12/24/2021)   PRAPARE - Administrator, Civil Service (Medical): No    Lack of Transportation (Non-Medical): No  Physical Activity: Inactive (12/24/2021)   Exercise Vital Sign    Days of Exercise per Week: 0 days    Minutes of Exercise per Session: 0 min  Stress: Stress Concern Present (12/24/2021)   [employer] of Occupational Health - Occupational Stress Questionnaire    Feeling of Stress : To some extent  Social Connections: Moderately Isolated (12/24/2021)   Social Connection and Isolation Panel [NHANES]    Frequency of Communication with Friends and Family: Three times a week    Frequency of Social Gatherings with Friends and Family: Three times a week    Attends Religious Services: Never    Active Member of Clubs or Organizations: No    Attends Banker Meetings: Never    Marital Status: Married  Catering Manager Violence: Not At Risk (12/24/2021)   Humiliation, Afraid, Rape, and Kick questionnaire    Fear of Current or Ex-Partner: No    Emotionally Abused: No    Physically Abused: No    Sexually Abused: No    History reviewed. No pertinent family history.   Current Outpatient Medications:    acetaminophen  (TYLENOL ) 500 MG tablet, Take 500 mg by mouth daily. (Patient not taking: Reported on 04/17/2023), Disp: , Rfl:   Physical exam:  Vitals:   10/20/23 1410  BP: 109/77  Pulse: 65  Resp: 18  Temp: (!) 95.9 F (35.5 C)  TempSrc: Tympanic  SpO2: 100%  Weight: 145 lb 1.6 oz (65.8 kg)   Physical Exam Cardiovascular:     Rate and Rhythm: Normal rate and regular rhythm.     Heart  sounds: Normal heart sounds.  Pulmonary:     Effort: Pulmonary effort is normal.     Breath sounds: Normal breath sounds.  Abdominal:     General: Bowel sounds are normal. There is no distension.     Palpations: Abdomen is soft.     Tenderness: There is no abdominal tenderness.     Comments: No palpable hepatosplenomegaly  Lymphadenopathy:     Comments: No palpable cervical, supraclavicular, axillary or inguinal adenopathy    Skin:    General: Skin is warm and dry.  Neurological:     Mental Status: He is alert and oriented to person, place, and time.         Latest Ref Rng & Units 10/20/2023    1:51 PM  CMP  Glucose 70 - 99 mg/dL 879   BUN 6 - 20 mg/dL 14   Creatinine 9.38 - 1.24 mg/dL 9.04   Sodium 864 - 854 mmol/L 135   Potassium 3.5 - 5.1 mmol/L  4.1   Chloride 98 - 111 mmol/L 104   CO2 22 - 32 mmol/L 24   Calcium 8.9 - 10.3 mg/dL 9.2   Total Protein 6.5 - 8.1 g/dL 8.2   Total Bilirubin 0.0 - 1.2 mg/dL 0.8   Alkaline Phos 38 - 126 U/L 69   AST 15 - 41 U/L 26   ALT 0 - 44 U/L 36       Latest Ref Rng & Units 10/20/2023    1:51 PM  CBC  WBC 4.0 - 10.5 K/uL 4.2   Hemoglobin 13.0 - 17.0 g/dL 86.7   Hematocrit 60.9 - 52.0 % 37.4   Platelets 150 - 400 K/uL 270      Assessment and plan- Patient is a 53 y.o. male with history of stage IIa extranodal marginal zone lymphoma s/p 4 cycles of Rituxan  currently in remission here for routine follow-up  Clinically patient is doing well with no concerning signs and symptoms of recurrence based on today's exam.  No palpable adenopathy hepatosplenomegaly or significant cytopenias.  I will see him back in 6 months with labs.  No indication for surveillance imaging at this time   Visit Diagnosis 1. Extranodal marginal zone B-cell lymphoma      Dr. Annah Skene, MD, MPH [facility] at [facility] [phone] 10/20/2023 4:23 PM

## 2024-04-19 ENCOUNTER — Ambulatory Visit: Payer: Self-pay | Admitting: Oncology

## 2024-04-19 ENCOUNTER — Other Ambulatory Visit: Payer: Self-pay

## 2024-05-31 ENCOUNTER — Encounter: Payer: Self-pay | Admitting: Oncology

## 2024-05-31 ENCOUNTER — Inpatient Hospital Stay: Payer: Self-pay | Attending: Oncology

## 2024-05-31 ENCOUNTER — Inpatient Hospital Stay (HOSPITAL_BASED_OUTPATIENT_CLINIC_OR_DEPARTMENT_OTHER): Payer: Self-pay | Admitting: Oncology

## 2024-05-31 VITALS — BP 118/81 | HR 61 | Temp 95.2°F | Resp 18 | Ht 66.0 in | Wt 143.2 lb

## 2024-05-31 DIAGNOSIS — Z08 Encounter for follow-up examination after completed treatment for malignant neoplasm: Secondary | ICD-10-CM

## 2024-05-31 DIAGNOSIS — C830A Small cell b-cell lymphoma, in remission: Secondary | ICD-10-CM | POA: Insufficient documentation

## 2024-05-31 DIAGNOSIS — C8309 Small cell B-cell lymphoma, extranodal and solid organ sites: Secondary | ICD-10-CM | POA: Insufficient documentation

## 2024-05-31 DIAGNOSIS — Z8572 Personal history of non-Hodgkin lymphomas: Secondary | ICD-10-CM

## 2024-05-31 LAB — CBC WITH DIFFERENTIAL/PLATELET
Abs Immature Granulocytes: 0.01 K/uL (ref 0.00–0.07)
Basophils Absolute: 0 K/uL (ref 0.0–0.1)
Basophils Relative: 1 %
Eosinophils Absolute: 0.4 K/uL (ref 0.0–0.5)
Eosinophils Relative: 10 %
HCT: 38.8 % — ABNORMAL LOW (ref 39.0–52.0)
Hemoglobin: 13.6 g/dL (ref 13.0–17.0)
Immature Granulocytes: 0 %
Lymphocytes Relative: 28 %
Lymphs Abs: 1.1 K/uL (ref 0.7–4.0)
MCH: 32.1 pg (ref 26.0–34.0)
MCHC: 35.1 g/dL (ref 30.0–36.0)
MCV: 91.5 fL (ref 80.0–100.0)
Monocytes Absolute: 0.3 K/uL (ref 0.1–1.0)
Monocytes Relative: 8 %
Neutro Abs: 2.2 K/uL (ref 1.7–7.7)
Neutrophils Relative %: 53 %
Platelets: 245 K/uL (ref 150–400)
RBC: 4.24 MIL/uL (ref 4.22–5.81)
RDW: 12.3 % (ref 11.5–15.5)
WBC: 4.1 K/uL (ref 4.0–10.5)
nRBC: 0 % (ref 0.0–0.2)

## 2024-05-31 LAB — COMPREHENSIVE METABOLIC PANEL WITH GFR
ALT: 26 U/L (ref 0–44)
AST: 29 U/L (ref 15–41)
Albumin: 3.9 g/dL (ref 3.5–5.0)
Alkaline Phosphatase: 83 U/L (ref 38–126)
Anion gap: 7 (ref 5–15)
BUN: 13 mg/dL (ref 6–20)
CO2: 22 mmol/L (ref 22–32)
Calcium: 9.1 mg/dL (ref 8.9–10.3)
Chloride: 107 mmol/L (ref 98–111)
Creatinine, Ser: 0.85 mg/dL (ref 0.61–1.24)
GFR, Estimated: >60 mL/min (ref >60)
Glucose, Bld: 98 mg/dL (ref 70–99)
Potassium: 4.1 mmol/L (ref 3.5–5.1)
Sodium: 136 mmol/L (ref 135–145)
Total Bilirubin: 0.9 mg/dL (ref 0.0–1.2)
Total Protein: 8.2 g/dL — ABNORMAL HIGH (ref 6.5–8.1)

## 2024-05-31 NOTE — Progress Notes (Signed)
 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  He does not have any known medical problems. He was noted to have swelling involving his right cheek that has been ongoing for the last 2 to 3 months.  Patient is here with his son today who reports that he feels that patient has lost a lot of weight in the last 3 months.  Patient endorses that his appetite is poor.  Denies any significant pain.   Patient was seen by ENT and underwent CT soft tissue neck which showed an enhancing lesion in the soft palate concerning for primary malignancy.  Bulky lymphadenopathy in the right neck with large mass centered in the right parotid gland multiple enlarged right cervical chain lymph nodes measuring up to 2 cm in size.  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  Ki-67 10 to 20%.  Overall immunoreactivity supports a differential diagnosis of extranodal marginal zone lymphoma versus lymphoplasmacytic lymphoma.  NYD 88 testing on the biopsy specimen was negative.   Patient had 4 weekly rituxan  cycles ending in July 2023 and is currently in remission    Interval history-patient reports doing well.  Appetite and weight have remained stable.   Denies any new aches and pains anywhere  ECOG PS- 0 Pain scale- 0   Review of systems- Review of Systems  Constitutional:  Negative for chills, fever, malaise/fatigue and weight loss.  HENT:  Negative for congestion, ear discharge and nosebleeds.   Eyes:  Negative for blurred vision.  Respiratory:  Negative for cough, hemoptysis, sputum production, shortness of breath and wheezing.   Cardiovascular:  Negative for chest pain, palpitations, orthopnea and claudication.  Gastrointestinal:  Negative for abdominal pain, blood in stool, constipation, diarrhea, heartburn, melena, nausea and vomiting.  Genitourinary:  Negative for dysuria, flank pain, frequency, hematuria and urgency.  Musculoskeletal:  Negative for back pain, joint pain and myalgias.  Skin:  Negative for rash.  Neurological:  Negative for dizziness, tingling, focal weakness, seizures, weakness and headaches.  Endo/Heme/Allergies:  Does not bruise/bleed easily.  Psychiatric/Behavioral:  Negative for depression and suicidal ideas. The patient does not have insomnia.       No Known Allergies   Past Medical History:  Diagnosis Date   History of COVID-19 2020   Lymphoma (HCC) 10/30/2021   low grade b cell lymphoma   Mass of right parotid gland      History reviewed. No pertinent surgical history.  Social History   Socioeconomic History   Marital status: Married    Spouse name: Not on file   Number of children: Not on file   Years of education: Not on file   Highest education level: Not on file  Occupational History   Not on file  Tobacco Use   Smoking status: Never    Passive exposure:  Never   Smokeless tobacco: Never  Vaping Use   Vaping status: Never Used  Substance and Sexual Activity   Alcohol use: Never   Drug use: Never   Sexual activity: Yes  Other Topics Concern   Not on file  Social History Narrative   Not on file   Social Drivers of Health   Financial Resource Strain: Medium Risk  (12/24/2021)   Overall Financial Resource Strain (CARDIA)    Difficulty of Paying Living Expenses: Somewhat hard  Food Insecurity: No Food Insecurity (12/24/2021)   Hunger Vital Sign    Worried About Running Out of Food in the Last Year: Never true    Ran Out of Food in the Last Year: Never true  Transportation Needs: No Transportation Needs (12/24/2021)   PRAPARE - Administrator, Civil Service (Medical): No    Lack of Transportation (Non-Medical): No  Physical Activity: Inactive (12/24/2021)   Exercise Vital Sign    Days of Exercise per Week: 0 days    Minutes of Exercise per Session: 0 min  Stress: Stress Concern Present (12/24/2021)   [employer] of Occupational Health - Occupational Stress Questionnaire    Feeling of Stress : To some extent  Social Connections: Moderately Isolated (12/24/2021)   Social Connection and Isolation Panel    Frequency of Communication with Friends and Family: Three times a week    Frequency of Social Gatherings with Friends and Family: Three times a week    Attends Religious Services: Never    Active Member of Clubs or Organizations: No    Attends Banker Meetings: Never    Marital Status: Married  Catering manager Violence: Not At Risk (12/24/2021)   Humiliation, Afraid, Rape, and Kick questionnaire    Fear of Current or Ex-Partner: No    Emotionally Abused: No    Physically Abused: No    Sexually Abused: No    History reviewed. No pertinent family history.   Current Outpatient Medications:    acetaminophen  (TYLENOL ) 500 MG tablet, Take 500 mg by mouth daily., Disp: , Rfl:   Physical exam:  Vitals:   05/31/24 1325  BP: 118/81  Pulse: 61  Resp: 18  Temp: (!) 95.2 F (35.1 C)  TempSrc: Tympanic  SpO2: 100%  Weight: 143 lb 3.2 oz (65 kg)  Height: 5' 6 (1.676 m)   Physical Exam Cardiovascular:     Rate and Rhythm: Normal rate and regular rhythm.     Heart sounds: Normal heart sounds.  Pulmonary:      Effort: Pulmonary effort is normal.     Breath sounds: Normal breath sounds.  Abdominal:     General: Bowel sounds are normal.     Palpations: Abdomen is soft.  Lymphadenopathy:     Comments: No palpable cervical, supraclavicular, axillary or inguinal adenopathy    Skin:    General: Skin is warm and dry.  Neurological:     Mental Status: He is alert and oriented to person, place, and time.      I have personally reviewed labs listed below:    Latest Ref Rng & Units 05/31/2024    1:18 PM  CMP  Glucose 70 - 99 mg/dL 98   BUN 6 - 20 mg/dL 13   Creatinine 9.38 - 1.24 mg/dL 9.14   Sodium 864 - 854 mmol/L 136   Potassium 3.5 - 5.1 mmol/L 4.1   Chloride 98 - 111 mmol/L 107   CO2 22 - 32 mmol/L 22  Calcium 8.9 - 10.3 mg/dL 9.1   Total Protein 6.5 - 8.1 g/dL 8.2   Total Bilirubin 0.0 - 1.2 mg/dL 0.9   Alkaline Phos 38 - 126 U/L 83   AST 15 - 41 U/L 29   ALT 0 - 44 U/L 26       Latest Ref Rng & Units 05/31/2024    1:18 PM  CBC  WBC 4.0 - 10.5 K/uL 4.1   Hemoglobin 13.0 - 17.0 g/dL 86.3   Hematocrit 60.9 - 52.0 % 38.8   Platelets 150 - 400 K/uL 245     Assessment and plan- Patient is a 53 y.o. male with history of stage IIa extranodal marginal zone lymphoma s/p 4 cycles of Rituxan .  He is currently in remission and this is a routine follow-up visit  Patient is now more than 2 years out from the diagnosis of extranodal marginal zone lymphoma.  Clinically he is in remission.  No B symptoms or palpable adenopathy or splenomegaly.  I will see him back in 1 year with labs prior.  Imaging will be only on basis of suspicious signs and symptoms   Visit Diagnosis 1. Encounter for follow-up surveillance of lymphoma      Dr. Annah Skene, MD, MPH [facility] at [facility] [phone] 05/31/2024 4:27 PM

## 2025-05-31 ENCOUNTER — Ambulatory Visit: Payer: Self-pay | Admitting: Oncology

## 2025-05-31 ENCOUNTER — Other Ambulatory Visit: Payer: Self-pay
